# Patient Record
Sex: Male | Born: 1935 | Hispanic: Yes | Smoking: Never smoker
Health system: Southern US, Community
[De-identification: ages and names within clinical notes are randomized; demographics above are authoritative.]

## PROBLEM LIST (undated history)

## (undated) DIAGNOSIS — B029 Zoster without complications: Secondary | ICD-10-CM

## (undated) DIAGNOSIS — Z972 Presence of dental prosthetic device (complete) (partial): Secondary | ICD-10-CM

## (undated) DIAGNOSIS — R42 Dizziness and giddiness: Secondary | ICD-10-CM

## (undated) DIAGNOSIS — N289 Disorder of kidney and ureter, unspecified: Secondary | ICD-10-CM

## (undated) DIAGNOSIS — L409 Psoriasis, unspecified: Secondary | ICD-10-CM

## (undated) DIAGNOSIS — I10 Essential (primary) hypertension: Secondary | ICD-10-CM

## (undated) HISTORY — PX: ESOPHAGOGASTRODUODENOSCOPY: SHX1529

## (undated) HISTORY — DX: Zoster without complications: B02.9

---

## 2005-04-02 ENCOUNTER — Ambulatory Visit: Payer: Self-pay | Admitting: Family Medicine

## 2011-11-07 ENCOUNTER — Ambulatory Visit: Payer: Self-pay | Admitting: Internal Medicine

## 2016-12-09 ENCOUNTER — Emergency Department
Admission: EM | Admit: 2016-12-09 | Discharge: 2016-12-09 | Disposition: A | Discharge status: Home / Self Care | Payer: Medicare (Managed Care) | Attending: Emergency Medicine | Admitting: Emergency Medicine

## 2016-12-09 ENCOUNTER — Emergency Department: Payer: Medicare (Managed Care)

## 2016-12-09 ENCOUNTER — Encounter: Payer: Self-pay | Admitting: Emergency Medicine

## 2016-12-09 DIAGNOSIS — I10 Essential (primary) hypertension: Secondary | ICD-10-CM | POA: Insufficient documentation

## 2016-12-09 DIAGNOSIS — R109 Unspecified abdominal pain: Secondary | ICD-10-CM | POA: Diagnosis present

## 2016-12-09 HISTORY — DX: Essential (primary) hypertension: I10

## 2016-12-09 LAB — COMPREHENSIVE METABOLIC PANEL
ALBUMIN: 4.2 g/dL (ref 3.5–5.0)
ALK PHOS: 61 U/L (ref 38–126)
ALT: 21 U/L (ref 17–63)
AST: 28 U/L (ref 15–41)
Anion gap: 5 (ref 5–15)
BILIRUBIN TOTAL: 0.7 mg/dL (ref 0.3–1.2)
BUN: 16 mg/dL (ref 6–20)
CO2: 26 mmol/L (ref 22–32)
CREATININE: 1.02 mg/dL (ref 0.61–1.24)
Calcium: 9.1 mg/dL (ref 8.9–10.3)
Chloride: 102 mmol/L (ref 101–111)
GFR calc Af Amer: >60 mL/min (ref >60)
Glucose, Bld: 122 mg/dL — ABNORMAL HIGH (ref 65–99)
Potassium: 3.9 mmol/L (ref 3.5–5.1)
Sodium: 133 mmol/L — ABNORMAL LOW (ref 135–145)
Total Protein: 7.1 g/dL (ref 6.5–8.1)

## 2016-12-09 LAB — URINALYSIS, COMPLETE (UACMP) WITH MICROSCOPIC
BACTERIA UA: NONE SEEN
Bilirubin Urine: NEGATIVE
GLUCOSE, UA: NEGATIVE mg/dL
Hgb urine dipstick: NEGATIVE
Ketones, ur: NEGATIVE mg/dL
LEUKOCYTES UA: NEGATIVE
Nitrite: NEGATIVE
PROTEIN: NEGATIVE mg/dL
SQUAMOUS EPITHELIAL / LPF: NONE SEEN
Specific Gravity, Urine: 1.006 (ref 1.005–1.030)
pH: 7 (ref 5.0–8.0)

## 2016-12-09 LAB — CBC
HCT: 39.5 % — ABNORMAL LOW (ref 40.0–52.0)
Hemoglobin: 13.5 g/dL (ref 13.0–18.0)
MCH: 31.5 pg (ref 26.0–34.0)
MCHC: 34.2 g/dL (ref 32.0–36.0)
MCV: 92.1 fL (ref 80.0–100.0)
PLATELETS: 166 10*3/uL (ref 150–440)
RBC: 4.29 MIL/uL — AB (ref 4.40–5.90)
RDW: 13 % (ref 11.5–14.5)
WBC: 4.2 10*3/uL (ref 3.8–10.6)

## 2016-12-09 LAB — LIPASE, BLOOD: LIPASE: 29 U/L (ref 11–51)

## 2016-12-09 LAB — TROPONIN I: Troponin I: <0.03 ng/mL (ref <0.03)

## 2016-12-09 LAB — TYPE AND SCREEN
ABO/RH(D): A POS
ANTIBODY SCREEN: NEGATIVE

## 2016-12-09 LAB — PROTIME-INR
INR: 1.03
PROTHROMBIN TIME: 13.5 s (ref 11.4–15.2)

## 2016-12-09 MED ORDER — FENTANYL CITRATE (PF) 100 MCG/2ML IJ SOLN
50.0000 ug | Freq: Once | INTRAMUSCULAR | Status: AC
  Administered 2016-12-09: 50 ug via INTRAVENOUS
  Filled 2016-12-09: qty 2

## 2016-12-09 MED ORDER — IOPAMIDOL (ISOVUE-300) INJECTION 61%
100.0000 mL | Freq: Once | INTRAVENOUS | Status: AC | PRN
  Administered 2016-12-09: 100 mL via INTRAVENOUS

## 2016-12-09 MED ORDER — SODIUM CHLORIDE 0.9 % IV BOLUS (SEPSIS)
500.0000 mL | Freq: Once | INTRAVENOUS | Status: AC
  Administered 2016-12-09: 500 mL via INTRAVENOUS

## 2016-12-09 MED ORDER — IOPAMIDOL (ISOVUE-300) INJECTION 61%
30.0000 mL | Freq: Once | INTRAVENOUS | Status: AC | PRN
  Administered 2016-12-09: 30 mL via ORAL

## 2016-12-09 NOTE — ED Provider Notes (Addendum)
[facility] Emergency Department Provider Note  ____________________________________________   I have reviewed the triage vital signs and the nursing notes.   HISTORY  Chief Complaint Abdominal Pain    HPI Shane Vazquez is a 83 y.o. male who presents today complaining of abdominal pain. He feels a intermittent shocklike discomfort in both sides of his abdomen. He states he has been somewhat constipated. Stools been brown and not dark or black. He denies any melena or bright red blood per rectum. He denies any hematemesis or vomiting. He has not had pain like this before. No history of abdominal surgeries. The pain is diffuse and sharp, no other associated symptoms. Nothing makes it better nothing makes it worse. He states he may be constipated. Never had this before. Feels like his muscles are twitching he states     Past Medical History:  Diagnosis Date  . Hypertension     There are no active problems to display for this patient.   History reviewed. No pertinent surgical history.  Prior to Admission medications   Not on File    Allergies Patient has no allergy information on record.  No family history on file.  Social History Social History  Substance Use Topics  . Smoking status: Never Smoker  . Smokeless tobacco: Never Used  . Alcohol use No    Review of Systems Constitutional: No fever/chills Eyes: No visual changes. ENT: No sore throat. No stiff neck no neck pain Cardiovascular: Denies chest pain. Respiratory: Denies shortness of breath. Gastrointestinal:   no vomiting.  No diarrhea.  No constipation. Genitourinary: Negative for dysuria. Musculoskeletal: Negative lower extremity swelling Skin: Negative for rash. Neurological: Negative for severe headaches, focal weakness or numbness.   ____________________________________________   PHYSICAL EXAM:  VITAL SIGNS: ED Triage Vitals  Enc Vitals Group     BP 12/09/16 0952 (!)  162/51     Pulse Rate 12/09/16 0951 (!) 50     Resp 12/09/16 0951 18     Temp 12/09/16 0951 98.1 F (36.7 C)     Temp Source 12/09/16 0951 Oral     SpO2 12/09/16 0951 97 %     Weight 12/09/16 0952 172 lb (78 kg)     Height 12/09/16 0952 5\' 5"  (1.651 m)     Head Circumference --      Peak Flow --      Pain Score 12/09/16 0955 5     Pain Loc --      Pain Edu? --      Excl. in [place]? --     Constitutional: Alert and oriented. Well appearing and in no acute distress. Eyes: Conjunctivae are normal Head: Atraumatic HEENT: No congestion/rhinnorhea. Mucous membranes are moist.  Oropharynx non-erythematous Neck:   Nontender with no meningismus, no masses, no stridor Cardiovascular: Normal rate, regular rhythm. Grossly normal heart sounds.  Good peripheral circulation. Respiratory: Normal respiratory effort.  No retractions. Lungs CTAB. Abdominal: Is soft, diffusely mildly tender, no focal mass or AAA. There is no guarding or rebound. Back:  There is no focal tenderness or step off.  there is no midline tenderness there are no lesions noted. there is no CVA tenderness Musculoskeletal: No lower extremity tenderness, no upper extremity tenderness. No joint effusions, no DVT signs strong distal pulses no edema Neurologic:  Normal speech and language. No gross focal neurologic deficits are appreciated.  Skin:  Skin is warm, dry and intact. No rash noted. Psychiatric: Mood and affect are normal. Speech and behavior  are normal.  ____________________________________________   LABS (all labs ordered are listed, but only abnormal results are displayed)  Labs Reviewed  COMPREHENSIVE METABOLIC PANEL - Abnormal; Notable for the following:       Result Value   Sodium 133 (*)    Glucose, Bld 122 (*)    All other components within normal limits  CBC - Abnormal; Notable for the following:    RBC 4.29 (*)    HCT 39.5 (*)    All other components within normal limits  URINALYSIS, COMPLETE (UACMP) WITH  MICROSCOPIC - Abnormal; Notable for the following:    Color, Urine STRAW (*)    APPearance CLEAR (*)    All other components within normal limits  LIPASE, BLOOD  PROTIME-INR  TYPE AND SCREEN   ____________________________________________  EKG  I personally interpreted any EKGs ordered by me or triage Normal sinus rhythm rate 51 beats per redness, but bradycardia noted. No ischemic changes noted. Nonspecific ST changes ____________________________________________  RADIOLOGY  I reviewed any imaging ordered by me or triage that were performed during my shift and, if possible, patient and/or family made aware of any abnormal findings. ____________________________________________   PROCEDURES  Procedure(s) performed: None  Procedures  Critical Care performed: None  ____________________________________________   INITIAL IMPRESSION / ASSESSMENT AND PLAN / ED COURSE  Pertinent labs & imaging results that were available during my care of the patient were reviewed by me and considered in my medical decision making (see chart for details).  Patient here 3 days of abdominal pain, blood work very reassuring vital signs are reassuring, reproducible pain with suspicion for ACS but he will do EKG. Patient is pain-free after fentanyl. Serial abdominal exams are benign. We will see what CT shows.  ----------------------------------------- 12:49 PM on 12/09/2016 -----------------------------------------  CT scan blood work and EKG vurinalysis and exam all very reassuring on this patient. I will check to see if he is guaiac positive although his hemoglobin is normal and we will await results of troponin although with 3 days of ongoing pain I don't think serial enzymes will be indicated in the has no chest pain or shortness of breath since mid abdominal discomfort. Nothing to suggest ischemia, CT certainly doesn't show anything like that, exam and don't think is consistent with "pain out of  proportionto exam", history is not really consistent with this, he has electric shock sensation. Can be muscle spasm as opposed, and white count normal, not clear exactly why this gentleman has been having abdominal pain.  ----------------------------------------- 1:00 PM on 12/09/2016 -----------------------------------------  Abdomen completely benign he has had no discomfort since arrival, workup is completely reassuring. There is some atherosclerotic changes in his abdomen which is not unusual for a 83 year old gentleman. However there is no evidence of acute ischemia. I think we'll have him follow-up with vascular surgery and they can evaluate him if they think that there is some chance of that but at this time I don't think he requires emergent surgery certainly no evidence despite 3 days of pain of ischemia on CT or blood work and he is pain-free at this time. It's a very unusual "electric shock" like discomfort that comes shooting and then goes completely away which is certainly atypical for ischemia. Is not worse with food. He has had no food avoidance or other classic symptoms of abdominal angina. He states that it's a shock that seems to go through his muscles. Certainly could also be radiculopathy. He is neurologically intact however. No evidence of cauda  equina syndrome. I feel at this point no further workup is required in the emergency room he we will see if he can tolerate by mouth if he can we will discharge him. Patient and family made aware of all findings including prostate issues etc. and the need for close outpatient follow-up with these results with primary care. They will follow-up he states.    ____________________________________________   FINAL CLINICAL IMPRESSION(S) / ED DIAGNOSES  Final diagnoses:  None      This chart was dictated using voice recognition software.  Despite best efforts to proofread,  errors can occur which can change meaning.      Schuyler Amor, MD 12/09/16 1157    Schuyler Amor, MD 12/09/16 1250    Schuyler Amor, MD 12/09/16 1302    Schuyler Amor, MD 12/09/16 [phone]

## 2016-12-09 NOTE — Discharge Instructions (Signed)
usted tiene aterosclerosis y su prstata est un poco agrandada, pero en este momento no hay hallazgos agudos en su abdomen. Haga un seguimiento pronto con el mdico de atencin primaria, y regrese al departamento de emergencias por cualquier sntoma nuevo o preocupante, como aumento del dolor, fiebre o vmitos.

## 2016-12-09 NOTE — ED Triage Notes (Signed)
Pt presents to ER accompanied by daughter with complaints of abdominal pain for past couple of days. pt reports pain started on LUQ reports moved to RLQ reports pain is sharp and is intermittent, denies any vomiting

## 2016-12-12 DIAGNOSIS — I10 Essential (primary) hypertension: Secondary | ICD-10-CM | POA: Insufficient documentation

## 2016-12-13 ENCOUNTER — Ambulatory Visit (INDEPENDENT_AMBULATORY_CARE_PROVIDER_SITE_OTHER): Payer: Medicare (Managed Care) | Admitting: Surgery

## 2016-12-13 ENCOUNTER — Encounter: Payer: Self-pay | Admitting: Surgery

## 2016-12-13 VITALS — BP 198/69 | HR 57 | Temp 97.8°F | Ht 66.0 in | Wt 169.0 lb

## 2016-12-13 DIAGNOSIS — R1013 Epigastric pain: Secondary | ICD-10-CM

## 2016-12-13 DIAGNOSIS — G8929 Other chronic pain: Secondary | ICD-10-CM

## 2016-12-13 NOTE — Progress Notes (Signed)
Shane Vazquez is an 83 y.o. male.   Chief Complaint: abdominal pain HPI: This is an outpatient consult requested by Dr. Burlene Arnt in the ED. Interviews held with a qualified interpreter. Patient describes epigastric pain and points to both right and left upper quadrants. Is been going on for several days is improved now but still present. He was in the emergency room. He's had no nausea vomiting no fevers or chills no weight loss. As have a strong family history of gastric cancer in his father. As not had any abdominal surgery and his only medical problem is hypertension.   Past Medical History:  Diagnosis Date  . Hypertension     Past Surgical History:  Procedure Laterality Date  . NO PAST SURGERIES      Family History  Problem Relation Age of Onset  . Stomach cancer Mother   . Heart disease Father    Social History:  reports that he has never smoked. He has never used smokeless tobacco. He reports that he does not drink alcohol or use drugs.  Allergies: No Known Allergies   (Not in a hospital admission)   Review of Systems:   Review of Systems  Constitutional: Negative.   HENT: Negative.   Eyes: Negative.   Respiratory: Negative.   Cardiovascular: Negative.   Gastrointestinal: Positive for abdominal pain. Negative for heartburn, nausea and vomiting.  Genitourinary: Negative.   Musculoskeletal: Negative.   Skin: Negative.   Neurological: Negative.   Endo/Heme/Allergies: Negative.   Psychiatric/Behavioral: Negative.     Physical Exam:  Physical Exam  Constitutional: He is oriented to person, place, and time and well-developed, well-nourished, and in no distress. No distress.  HENT:  Head: Normocephalic and atraumatic.  Eyes: Pupils are equal, round, and reactive to light. Right eye exhibits no discharge. Left eye exhibits no discharge. No scleral icterus.  Neck: Normal range of motion.  Cardiovascular: Normal rate, regular rhythm and normal heart sounds.    Pulmonary/Chest: Effort normal and breath sounds normal. No respiratory distress. He has no wheezes. He has no rales.  Abdominal: Soft. Bowel sounds are normal. He exhibits distension. There is no tenderness. There is no rebound and no guarding.  Distended and slightly firm abdomen with only minimal if any tenderness in both upper quadrants. There is a rectus diastases. No obvious umbilical hernia. There are no peritoneal signs. The abdomen just generally feels firm.  Musculoskeletal: Normal range of motion. He exhibits no edema or tenderness.  Lymphadenopathy:    He has no cervical adenopathy.  Neurological: He is alert and oriented to person, place, and time.  Skin: Skin is warm and dry. No rash noted. He is not diaphoretic. No erythema.  Vitals reviewed.   Blood pressure (!) 198/69, pulse (!) 57, temperature 97.8 F (36.6 C), temperature source Oral, height 5\' 6"  (1.676 m), weight 169 lb (76.7 kg).    No results found for this or any previous visit (from the past 48 hour(s)). No results found.   Assessment/Plan This patient is interview was assisted with a qualified interpreter. He was in the emergency room with abdominal pain is abdominal pain is better but not gone and he points to both upper quadrants. On exam he feels firm but minimally if any tenderness is noted. Certainly no peritoneal signs are identified. CT scan is personally reviewed as are her labs. My concern is that this patient has one of 2 problems. 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 I  would like him to see a vascular surgeon concerning this as this might be one of the causes of his abdominal pain as a low-flow state although there is no sign of ongoing ischemia at this time. He may benefit from stenting.  The other alternative is that he has a very strong family history of gastric cancer and there is some thickening seen on multiple cuts of the pylorus. There is also an  intraluminal mass effect seen by me personally. I would like to have him evaluated by GI physician I will send a GI referral for him as well.  This was all discussed via the interpreter with he and his family member and he will follow-up with [place] in 3 weeks.  Florene Glen, MD, FACS

## 2016-12-13 NOTE — Patient Instructions (Addendum)
Yo le voy a Market researcher para que vea al [facility] y al Human resources officer. Si tiene preguntas, por favor llameme al [phone] (Enfermera: Kellen Hover).  Lo vamos a volver a ver en cuatro semanas.

## 2016-12-14 ENCOUNTER — Telehealth: Payer: Self-pay

## 2016-12-14 NOTE — Telephone Encounter (Signed)
Called patient's daughter-Diana and gave her the information for the appointments seen below. Beverlee Nims did not have any further questions.

## 2016-12-14 NOTE — Telephone Encounter (Signed)
-----   Message from Mickie Kay sent at 12/14/2016 10:43 AM EDT ----- Regarding: RE: referrals Patient's appt's have been made.   GI-Dr Marius Ditch 01/17/17 @ 9:00am @ the [facility] 01/22/17 @ 8:30am with Dr Dew--[address], [place], [place] [address]  Please call patient to advise.   ----- Message ----- From: Wayna Chalet, [place] Sent: 12/13/2016   2:49 PM To: Albin Felling Brouillard Subject: referrals                                      Can you please refer patient to be seen by Dr. Vicente Males for luminal narrowing of the pylorus which is less evident on the delayed images, probably due to peristalsis and Dr. 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 Thank you!  Patient will need an EGD.

## 2016-12-16 ENCOUNTER — Emergency Department
Admission: EM | Admit: 2016-12-16 | Discharge: 2016-12-16 | Disposition: A | Discharge status: Home / Self Care | Payer: Medicare (Managed Care) | Attending: Emergency Medicine | Admitting: Emergency Medicine

## 2016-12-16 DIAGNOSIS — R109 Unspecified abdominal pain: Secondary | ICD-10-CM | POA: Diagnosis not present

## 2016-12-16 DIAGNOSIS — B029 Zoster without complications: Secondary | ICD-10-CM | POA: Insufficient documentation

## 2016-12-16 DIAGNOSIS — I10 Essential (primary) hypertension: Secondary | ICD-10-CM | POA: Diagnosis not present

## 2016-12-16 DIAGNOSIS — R1084 Generalized abdominal pain: Secondary | ICD-10-CM

## 2016-12-16 LAB — CBC
HCT: 38 % — ABNORMAL LOW (ref 40.0–52.0)
HEMOGLOBIN: 13 g/dL (ref 13.0–18.0)
MCH: 31.6 pg (ref 26.0–34.0)
MCHC: 34.3 g/dL (ref 32.0–36.0)
MCV: 92 fL (ref 80.0–100.0)
Platelets: 154 10*3/uL (ref 150–440)
RBC: 4.13 MIL/uL — AB (ref 4.40–5.90)
RDW: 12.9 % (ref 11.5–14.5)
WBC: 4.6 10*3/uL (ref 3.8–10.6)

## 2016-12-16 LAB — URINALYSIS, COMPLETE (UACMP) WITH MICROSCOPIC
Bacteria, UA: NONE SEEN
Bilirubin Urine: NEGATIVE
GLUCOSE, UA: NEGATIVE mg/dL
HGB URINE DIPSTICK: NEGATIVE
KETONES UR: 5 mg/dL — AB
Leukocytes, UA: NEGATIVE
NITRITE: NEGATIVE
PROTEIN: 30 mg/dL — AB
Specific Gravity, Urine: 1.017 (ref 1.005–1.030)
Squamous Epithelial / LPF: NONE SEEN
pH: 6 (ref 5.0–8.0)

## 2016-12-16 LAB — COMPREHENSIVE METABOLIC PANEL
ALK PHOS: 65 U/L (ref 38–126)
ALT: 18 U/L (ref 17–63)
ANION GAP: 8 (ref 5–15)
AST: 27 U/L (ref 15–41)
Albumin: 4 g/dL (ref 3.5–5.0)
BILIRUBIN TOTAL: 1.2 mg/dL (ref 0.3–1.2)
BUN: 10 mg/dL (ref 6–20)
CALCIUM: 9.2 mg/dL (ref 8.9–10.3)
CO2: 27 mmol/L (ref 22–32)
Chloride: 96 mmol/L — ABNORMAL LOW (ref 101–111)
Creatinine, Ser: 1.14 mg/dL (ref 0.61–1.24)
GFR calc non Af Amer: 57 mL/min — ABNORMAL LOW (ref >60)
Glucose, Bld: 143 mg/dL — ABNORMAL HIGH (ref 65–99)
Potassium: 4.4 mmol/L (ref 3.5–5.1)
Sodium: 131 mmol/L — ABNORMAL LOW (ref 135–145)
TOTAL PROTEIN: 6.9 g/dL (ref 6.5–8.1)

## 2016-12-16 LAB — LACTIC ACID, PLASMA: Lactic Acid, Venous: 1.2 mmol/L (ref 0.5–1.9)

## 2016-12-16 LAB — LIPASE, BLOOD: LIPASE: 29 U/L (ref 11–51)

## 2016-12-16 MED ORDER — ACYCLOVIR 800 MG PO TABS: 800.0000 mg | ORAL_TABLET | Freq: Every day | ORAL | 0 refills | Status: AC

## 2016-12-16 MED ORDER — TRAMADOL HCL 50 MG PO TABS: 50.0000 mg | ORAL_TABLET | Freq: Four times a day (QID) | ORAL | 0 refills | Status: AC | PRN

## 2016-12-16 NOTE — ED Notes (Signed)
Pt from waiting room =- labs drawn in triage. Interpreter notification sent. Pt here for abd pain x 1 week.

## 2016-12-16 NOTE — Discharge Instructions (Signed)
Please seek medical attention for any high fevers, chest pain, shortness of breath, change in behavior, persistent vomiting, bloody stool or any other new or concerning symptoms.  

## 2016-12-16 NOTE — ED Triage Notes (Signed)
Pt presents to ED via POV with daughter and c/o RLQ pain. Pt was seen here 1 week ago for same and has a follow-up appointment on Sept 6 for unknown diagnosis. Pt denies N/V/D. Pt is A&O, in NAD, RR even, regular, and unlabored.

## 2016-12-16 NOTE — ED Provider Notes (Signed)
[facility] Emergency Department Provider Note  ____________________________________________   I have reviewed the triage vital signs and the nursing notes.   HISTORY  Chief Complaint Abdominal Pain   History limited by: Loraine utilized   HPI Shane Vazquez is a 83 y.o. male who presents to the emergency department today with continued abdominal pain. It is located bilaterally. It will come and go. Patient describes it as getting an electric shock. It is severe. It did prevent him from sleeping last night. Was seen in the emergency department for this with work up including CT scan. Did follow up with surgery. Today patient also mentioned a rash on his right thigh. It has been present for roughly 8 days, he did not mention the rash to any previous providers. It is painful.    Past Medical History:  Diagnosis Date  . Hypertension     Patient Active Problem List   Diagnosis Date Noted  . Essential hypertension 12/12/2016    Past Surgical History:  Procedure Laterality Date  . NO PAST SURGERIES      Prior to Admission medications   Not on File    Allergies Patient has no known allergies.  Family History  Problem Relation Age of Onset  . Stomach cancer Mother   . Heart disease Father     Social History Social History  Substance Use Topics  . Smoking status: Never Smoker  . Smokeless tobacco: Never Used  . Alcohol use No    Review of Systems Constitutional: No fever/chills Eyes: No visual changes. ENT: No sore throat. Cardiovascular: Denies chest pain. Respiratory: Denies shortness of breath. Gastrointestinal: Positive for abdominal pain. Genitourinary: Negative for dysuria. Musculoskeletal: Negative for back pain. Skin: Positive for rash to right thigh Neurological: Negative for headaches, focal weakness or numbness.  ____________________________________________   PHYSICAL EXAM:  VITAL SIGNS: ED  Triage Vitals  Enc Vitals Group     BP 12/16/16 1327 (!) 155/60     Pulse Rate 12/16/16 1327 65     Resp 12/16/16 1327 18     Temp 12/16/16 1327 98.3 F (36.8 C)     Temp Source 12/16/16 1327 Oral     SpO2 12/16/16 1327 97 %     Weight 12/16/16 1324 175 lb (79.4 kg)     Height 12/16/16 1324 5\' 5"  (1.651 m)     Head Circumference --      Peak Flow --      Pain Score 12/16/16 1324 7   Constitutional: Alert and oriented. Well appearing and in no distress. Eyes: Conjunctivae are normal.  ENT   Head: Normocephalic and atraumatic.   Nose: No congestion/rhinnorhea.   Mouth/Throat: Mucous membranes are moist.   Neck: No stridor. Hematological/Lymphatic/Immunilogical: No cervical lymphadenopathy. Cardiovascular: Normal rate, regular rhythm.  No murmurs, rubs, or gallops.  Respiratory: Normal respiratory effort without tachypnea nor retractions. Breath sounds are clear and equal bilaterally. No wheezes/rales/rhonchi. Gastrointestinal: Soft and non tender. No rebound. No guarding.  Genitourinary: Deferred Musculoskeletal: Normal range of motion in all extremities. No lower extremity edema. Neurologic:  Normal speech and language. No gross focal neurologic deficits are appreciated.  Skin:  Rash to right thigh, primarily in the frontal and lateral aspects. Vesicular.  Psychiatric: Mood and affect are normal. Speech and behavior are normal. Patient exhibits appropriate insight and judgment.  ____________________________________________    LABS (pertinent positives/negatives)  Labs Reviewed  COMPREHENSIVE METABOLIC PANEL - Abnormal; Notable for the following:  Result Value   Sodium 131 (*)    Chloride 96 (*)    Glucose, Bld 143 (*)    GFR calc non Af Amer 57 (*)    All other components within normal limits  CBC - Abnormal; Notable for the following:    RBC 4.13 (*)    HCT 38.0 (*)    All other components within normal limits  URINALYSIS, COMPLETE (UACMP) WITH  MICROSCOPIC - Abnormal; Notable for the following:    Color, Urine YELLOW (*)    APPearance CLEAR (*)    Ketones, ur 5 (*)    Protein, ur 30 (*)    All other components within normal limits  LIPASE, BLOOD  LACTIC ACID, PLASMA  LACTIC ACID, PLASMA     ____________________________________________   EKG  None  ____________________________________________    RADIOLOGY  None  ____________________________________________   PROCEDURES  Procedures  ____________________________________________   INITIAL IMPRESSION / ASSESSMENT AND PLAN / ED COURSE  Pertinent labs & imaging results that were available during my care of the patient were reviewed by me and considered in my medical decision making (see chart for details).  Patient presented to the emergency department today with concerns for continued abdominal pain. Today he did also mention a rash on his right thigh. The rash is concerning for possible symptoms. I do wonder if this plane rolled the patient's pain. Will place patient on antivirals. Will give patient pain medication. Patient will follow-up.  ____________________________________________   FINAL CLINICAL IMPRESSION(S) / ED DIAGNOSES  Final diagnoses:  Generalized abdominal pain  Herpes zoster without complication     Note: This dictation was prepared with Dragon dictation. Any transcriptional errors that result from this process are unintentional     Nance Pear, MD 12/16/16 [phone]

## 2016-12-16 NOTE — ED Notes (Signed)
Per dr pt has shingles and is not on antiviral -

## 2016-12-16 NOTE — ED Notes (Signed)
This RN to bedside at this time to introduce self to patient and his daughter. Pt is noted to be alert and oriented, walking around room without difficulty. This RN explained will need to place IV for further blood work, pt and daughter state understanding. Will continue to monitor for further patient needs.

## 2017-01-16 ENCOUNTER — Ambulatory Visit: Payer: Self-pay | Admitting: Surgery

## 2017-01-17 ENCOUNTER — Other Ambulatory Visit: Payer: Self-pay

## 2017-01-17 ENCOUNTER — Ambulatory Visit: Payer: Self-pay | Admitting: Gastroenterology

## 2017-01-17 ENCOUNTER — Encounter (INDEPENDENT_AMBULATORY_CARE_PROVIDER_SITE_OTHER): Payer: Self-pay

## 2017-01-17 ENCOUNTER — Encounter: Payer: Self-pay | Admitting: Gastroenterology

## 2017-01-17 ENCOUNTER — Ambulatory Visit (INDEPENDENT_AMBULATORY_CARE_PROVIDER_SITE_OTHER): Payer: Medicare HMO | Admitting: Gastroenterology

## 2017-01-17 VITALS — BP 138/57 | HR 58 | Temp 98.2°F | Ht 66.0 in | Wt 162.6 lb

## 2017-01-17 DIAGNOSIS — R1031 Right lower quadrant pain: Secondary | ICD-10-CM | POA: Diagnosis not present

## 2017-01-17 DIAGNOSIS — R933 Abnormal findings on diagnostic imaging of other parts of digestive tract: Secondary | ICD-10-CM | POA: Diagnosis not present

## 2017-01-17 NOTE — Addendum Note (Signed)
Addended by: Vanetta Mulders on: 01/17/2017 04:20 PM   Modules accepted: Orders, SmartSet

## 2017-01-17 NOTE — Progress Notes (Addendum)
Cephas Darby, MD [address]  [facility]  [place], [place] [address]  Main: [phone]  Fax: [phone]    Gastroenterology Consultation  Referring Provider:     Florene Glen, MD Primary Care Physician:  Phoebe Perch Primary Gastroenterologist:  Dr. Cephas Darby Reason for Consultation:     RLQ pain         HPI:   Shane Vazquez is a 83 y.o. y/o male referred  for consultation & management of 1 month history of right lower quadrant pain. Patient is Spanish-speaking and is accompanied by her daughter who helped with interpretation. The pain was initially in the left upper quadrant, moved to right lower quadrant, sharp, intermittent, 5/10 in severity, lasts for about 5 minutes, not associated with nausea, vomiting. Sporadic in nature. He denies any aggravating or relieving factors. He went to ER twice in last 1 month due to a poor symptoms and had blood work, abdominal imaging. He reports that his stools are loose and dark brown since the pain started. He started gives him stool softener about 3 times a week for history of constipation. He denies upper abdominal pain, trouble swallowing, melena, hematochezia, rectal bleeding, diarrhea. He had CT A/P on 12/09/16 showed luminal narrowing of the pylorus, cholelithiasis, pancolonic diverticulosis. LFTs, CBC, lipase was normal. He was taking Motrin 400 mg 2-3 pills for the pain, presently he is taking tramadol and gabapentin. He recently had a checkup in [place] prior to moving to [place].   GI Procedures: None  Past Medical History:  Diagnosis Date  . Hypertension     Past Surgical History:  Procedure Laterality Date  . NO PAST SURGERIES      Prior to Admission medications   Medication Sig Start Date End Date Taking? Authorizing Provider  gabapentin (NEURONTIN) 300 MG capsule TAKE ONE CAPSULE BY MOUTH 3 TIMES A DAY AS NEEDED 12/25/16   [provider]  ibuprofen (ADVIL,MOTRIN) 200 MG tablet Take 200 mg by mouth every  6 (six) hours as needed.    [provider]  traMADol (ULTRAM) 50 MG tablet Take 1 tablet (50 mg total) by mouth every 6 (six) hours as needed. 12/16/16 12/16/17  Nance Pear, MD    Family History  Problem Relation Age of Onset  . Stomach cancer Mother   . Heart disease Father      Social History  Substance Use Topics  . Smoking status: Never Smoker  . Smokeless tobacco: Never Used  . Alcohol use No    Allergies as of 01/17/2017  . (No Known Allergies)    Review of Systems:    All systems reviewed and negative except where noted in HPI.   Physical Exam:  BP (!) 138/57   Pulse (!) 58   Temp 98.2 F (36.8 C)   Ht 5\' 6"  (1.676 m)   Wt 162 lb 9.6 oz (73.8 kg)   BMI 26.24 kg/m  No LMP for male patient.  General:   Alert,  Well-developed, well-nourished, pleasant and cooperative in NAD Head:  Normocephalic and atraumatic. Eyes:  Sclera clear, no icterus.   Conjunctiva pink. Ears:  Normal auditory acuity. Neck:  Supple; no masses or thyromegaly. Lungs:  Respirations even and unlabored.  Clear throughout to auscultation.   No wheezes, crackles, or rhonchi. No acute distress. Heart:  Regular rate and rhythm; no murmurs, clicks, rubs, or gallops. Abdomen:  Normal bowel sounds.  No bruits.  Soft, mild tenderness in the right lower quadrant and  non-distended without masses, hepatosplenomegaly or hernias noted.  No guarding or rebound tenderness.   Rectal: Nor performed Msk:  Symmetrical without gross deformities.  Pulses:  Normal pulses noted. Extremities:  No clubbing or edema.  No cyanosis. Neurologic:  Alert and oriented x3; Skin:  Psoriatic rash on his palms, lower back. No jaundice. Psych:  Alert and cooperative. Normal mood and affect.  Imaging Studies: Reviewed  Assessment and Plan:   Shane Vazquez is a 83 y.o. y/o male with Hypertension, otherwise healthy with 1 month history of intermittent right lower quadrant pain. CT A/P showed possible narrowing of the  pylorus, pancolonic diverticulosis and cholelithiasis. He may have mild attack of diverticulitis. As he never had a colonoscopy, malignancy needs to be ruled out. I also recommend EGD due to possible narrowing of the pylorus seen on the CT.  I have discussed alternative options, risks & benefits,  which include, but are not limited to, bleeding, infection, perforation,respiratory complication & drug reaction.  The patient agrees with this plan & written consent will be obtained.    Start MiraLAX 17 g daily to manage constipation  Follow up based on the EGD and colonoscopy results   Cephas Darby, MD

## 2017-01-22 ENCOUNTER — Ambulatory Visit (INDEPENDENT_AMBULATORY_CARE_PROVIDER_SITE_OTHER): Payer: Medicare HMO | Admitting: Vascular Surgery

## 2017-01-22 ENCOUNTER — Encounter (INDEPENDENT_AMBULATORY_CARE_PROVIDER_SITE_OTHER): Payer: Self-pay | Admitting: Vascular Surgery

## 2017-01-22 VITALS — BP 165/64 | HR 47 | Resp 16 | Ht 61.0 in | Wt 160.0 lb

## 2017-01-22 DIAGNOSIS — K551 Chronic vascular disorders of intestine: Secondary | ICD-10-CM

## 2017-01-22 DIAGNOSIS — I10 Essential (primary) hypertension: Secondary | ICD-10-CM

## 2017-01-22 DIAGNOSIS — I771 Stricture of artery: Secondary | ICD-10-CM

## 2017-01-22 DIAGNOSIS — R1031 Right lower quadrant pain: Secondary | ICD-10-CM | POA: Diagnosis not present

## 2017-01-22 DIAGNOSIS — I700 Atherosclerosis of aorta: Secondary | ICD-10-CM | POA: Diagnosis not present

## 2017-01-22 NOTE — Progress Notes (Signed)
Patient ID: Shane Vazquez, male   DOB: 30-Jan-1930, 83 y.o.   MRN: [id number]  Chief Complaint  Patient presents with  . New Patient (Initial Visit)    ASO of Aorta and coronary arteries    HPI Shane Vazquez is a 83 y.o. male.  I am asked to see the patient by Dr. Burt Knack for evaluation of aortic and visceral atherosclerosis.  The patient reports Intermittent abdominal pain including an episode last month which prompted an emergency room visit and a CT scan which I have independently reviewed. He has moderate aortic atherosclerosis including extension into some of the branch vessels. There is certainly at least a question of SMA stenosis although on the 5 mm cuts that is a little hard to say for sure. His abdominal pain has improved. He was diagnosed with shingles and that appears to be the cause of the pain. He has no pain today and his pain has generally been much better. In questioning, we discussed claudication symptoms, food fear, unintentional weight loss, or postprandial abdominal pain. He does not have any of the symptoms. He says he walks at least 30 minutes a day and was a vigorous soccer player for many years which is helped him remain quite active. He denies ischemic rest pain or ulceration. He has no history of vascular interventions or procedures to his knowledge.   Past Medical History:  Diagnosis Date  . Hypertension   . Shingles     Past Surgical History:  Procedure Laterality Date  . NO PAST SURGERIES      Family History  Problem Relation Age of Onset  . Stomach cancer Mother   . Heart disease Father   No bleeding or clotting disorders  Social History Social History  Substance Use Topics  . Smoking status: Never Smoker  . Smokeless tobacco: Never Used  . Alcohol use No   No IVDU  No Known Allergies  Current Outpatient Prescriptions  Medication Sig Dispense Refill  . gabapentin (NEURONTIN) 300 MG capsule TAKE ONE CAPSULE BY MOUTH 3 TIMES A DAY AS NEEDED  0  .  losartan (COZAAR) 100 MG tablet Take 100 mg by mouth daily.    . traMADol (ULTRAM) 50 MG tablet Take 1 tablet (50 mg total) by mouth every 6 (six) hours as needed. 15 tablet 0  . ibuprofen (ADVIL,MOTRIN) 200 MG tablet Take 200 mg by mouth every 6 (six) hours as needed.     No current facility-administered medications for this visit.       REVIEW OF SYSTEMS (Negative unless checked)  Constitutional: []Weight loss  []Fever  []Chills Cardiac: []Chest pain   []Chest pressure   []Palpitations   []Shortness of breath when laying flat   []Shortness of breath at rest   []Shortness of breath with exertion. Vascular:  []Pain in legs with walking   []Pain in legs at rest   []Pain in legs when laying flat   []Claudication   []Pain in feet when walking  []Pain in feet at rest  []Pain in feet when laying flat   []History of DVT   []Phlebitis   []Swelling in legs   []Varicose veins   []Non-healing ulcers Pulmonary:   []Uses home oxygen   []Productive cough   []Hemoptysis   []Wheeze  []COPD   []Asthma Neurologic:  [x]Dizziness  []Blackouts   []Seizures   []History of stroke   []History of TIA  []Aphasia   []Temporary blindness   []Dysphagia   []Weakness or numbness in  arms   []Weakness or numbness in legs Musculoskeletal:  [x]Arthritis   []Joint swelling   []Joint pain   []Low back pain Hematologic:  []Easy bruising  []Easy bleeding   []Hypercoagulable state   []Anemic  []Hepatitis Gastrointestinal:  []Blood in stool   []Vomiting blood  []Gastroesophageal reflux/heartburn   [x]Abdominal pain Genitourinary:  []Chronic kidney disease   []Difficult urination  []Frequent urination  []Burning with urination   []Hematuria Skin:  []Rashes   []Ulcers   []Wounds Psychological:  []History of anxiety   [] History of major depression.    Physical Exam BP (!) 165/64 (BP Location: Right Arm)   Pulse (!) 47   Resp 16   Ht 5' 1" (1.549 m)   Wt 160 lb (72.6 kg)   BMI 30.23 kg/m  Gen:  WD/WN, NAD. Appears younger  than stated age. Head: [place]/AT, No temporalis wasting.  Ear/Nose/Throat: Hearing grossly intact, nares w/o erythema or drainage, oropharynx w/o Erythema/Exudate Eyes: Conjunctiva clear, sclera non-icteric  Neck: trachea midline.  No bruit or JVD.  Pulmonary:  Good air movement, clear to auscultation bilaterally.  Cardiac: RRR, normal S1, S2, no Murmurs, rubs or gallops. Vascular:  Vessel Right Left  Radial Palpable Palpable  Ulnar Palpable Palpable  Brachial Palpable Palpable  Carotid Palpable, without bruit Palpable, without bruit  Aorta Not palpable N/A  Femoral Palpable Palpable  Popliteal Palpable Palpable  PT Palpable Palpable  DP Palpable Palpable   Gastrointestinal: soft, non-tender/non-distended.  Musculoskeletal: M/S 5/5 throughout.  Extremities without ischemic changes.  No deformity or atrophy. No edema. Neurologic: Sensation grossly intact in extremities.  Symmetrical.  Speech is fluent. Motor exam as listed above. Psychiatric: Judgment intact, Mood & affect appropriate for pt's clinical situation. Dermatologic: No rashes or ulcers noted.  No cellulitis or open wounds.  Radiology No results found.  Labs Recent Results (from the past 2160 hour(s))  Lipase, blood     Status: None   Collection Time: 12/09/16 10:03 AM  Result Value Ref Range   Lipase 29 11 - 51 U/L  Comprehensive metabolic panel     Status: Abnormal   Collection Time: 12/09/16 10:03 AM  Result Value Ref Range   Sodium 133 (L) 135 - 145 mmol/L   Potassium 3.9 3.5 - 5.1 mmol/L   Chloride 102 101 - 111 mmol/L   CO2 26 22 - 32 mmol/L   Glucose, Bld 122 (H) 65 - 99 mg/dL   BUN 16 6 - 20 mg/dL   Creatinine, Ser 1.02 0.61 - 1.24 mg/dL   Calcium 9.1 8.9 - 10.3 mg/dL   Total Protein 7.1 6.5 - 8.1 g/dL   Albumin 4.2 3.5 - 5.0 g/dL   AST 28 15 - 41 U/L   ALT 21 17 - 63 U/L   Alkaline Phosphatase 61 38 - 126 U/L   Total Bilirubin 0.7 0.3 - 1.2 mg/dL   GFR calc non Af Amer >60 >60 mL/min   GFR calc Af  Amer >60 >60 mL/min    Comment: (NOTE) The eGFR has been calculated using the CKD EPI equation. This calculation has not been validated in all clinical situations. eGFR's persistently <60 mL/min signify possible Chronic Kidney Disease.    Anion gap 5 5 - 15  CBC     Status: Abnormal   Collection Time: 12/09/16 10:03 AM  Result Value Ref Range   WBC 4.2 3.8 - 10.6 K/uL   RBC 4.29 (L) 4.40 - 5.90 MIL/uL   Hemoglobin 13.5 13.0 - 18.0 g/dL  HCT 39.5 (L) 40.0 - 52.0 %   MCV 92.1 80.0 - 100.0 fL   MCH 31.5 26.0 - 34.0 pg   MCHC 34.2 32.0 - 36.0 g/dL   RDW 13.0 11.5 - 14.5 %   Platelets 166 150 - 440 K/uL  Troponin I     Status: None   Collection Time: 12/09/16 10:03 AM  Result Value Ref Range   Troponin I <0.03 <0.03 ng/mL  Urinalysis, Complete w Microscopic     Status: Abnormal   Collection Time: 12/09/16 10:24 AM  Result Value Ref Range   Color, Urine STRAW (A) YELLOW   APPearance CLEAR (A) CLEAR   Specific Gravity, Urine 1.006 1.005 - 1.030   pH 7.0 5.0 - 8.0   Glucose, UA NEGATIVE NEGATIVE mg/dL   Hgb urine dipstick NEGATIVE NEGATIVE   Bilirubin Urine NEGATIVE NEGATIVE   Ketones, ur NEGATIVE NEGATIVE mg/dL   Protein, ur NEGATIVE NEGATIVE mg/dL   Nitrite NEGATIVE NEGATIVE   Leukocytes, UA NEGATIVE NEGATIVE   RBC / HPF 0-5 0 - 5 RBC/hpf   WBC, UA 0-5 0 - 5 WBC/hpf   Bacteria, UA NONE SEEN NONE SEEN   Squamous Epithelial / LPF NONE SEEN NONE SEEN  Protime-INR     Status: None   Collection Time: 12/09/16 10:24 AM  Result Value Ref Range   Prothrombin Time 13.5 11.4 - 15.2 seconds   INR 1.03   Type and screen [facility]     Status: None   Collection Time: 12/09/16 10:24 AM  Result Value Ref Range   ABO/RH(D) A POS    Antibody Screen NEG    Sample Expiration 12/12/2016   Lipase, blood     Status: None   Collection Time: 12/16/16  1:27 PM  Result Value Ref Range   Lipase 29 11 - 51 U/L  Comprehensive metabolic panel     Status: Abnormal    Collection Time: 12/16/16  1:27 PM  Result Value Ref Range   Sodium 131 (L) 135 - 145 mmol/L   Potassium 4.4 3.5 - 5.1 mmol/L   Chloride 96 (L) 101 - 111 mmol/L   CO2 27 22 - 32 mmol/L   Glucose, Bld 143 (H) 65 - 99 mg/dL   BUN 10 6 - 20 mg/dL   Creatinine, Ser 1.14 0.61 - 1.24 mg/dL   Calcium 9.2 8.9 - 10.3 mg/dL   Total Protein 6.9 6.5 - 8.1 g/dL   Albumin 4.0 3.5 - 5.0 g/dL   AST 27 15 - 41 U/L   ALT 18 17 - 63 U/L   Alkaline Phosphatase 65 38 - 126 U/L   Total Bilirubin 1.2 0.3 - 1.2 mg/dL   GFR calc non Af Amer 57 (L) >60 mL/min   GFR calc Af Amer >60 >60 mL/min    Comment: (NOTE) The eGFR has been calculated using the CKD EPI equation. This calculation has not been validated in all clinical situations. eGFR's persistently <60 mL/min signify possible Chronic Kidney Disease.    Anion gap 8 5 - 15  CBC     Status: Abnormal   Collection Time: 12/16/16  1:27 PM  Result Value Ref Range   WBC 4.6 3.8 - 10.6 K/uL   RBC 4.13 (L) 4.40 - 5.90 MIL/uL   Hemoglobin 13.0 13.0 - 18.0 g/dL   HCT 38.0 (L) 40.0 - 52.0 %   MCV 92.0 80.0 - 100.0 fL   MCH 31.6 26.0 - 34.0 pg   MCHC 34.3 32.0 -   36.0 g/dL   RDW 12.9 11.5 - 14.5 %   Platelets 154 150 - 440 K/uL  Urinalysis, Complete w Microscopic     Status: Abnormal   Collection Time: 12/16/16  1:28 PM  Result Value Ref Range   Color, Urine YELLOW (A) YELLOW   APPearance CLEAR (A) CLEAR   Specific Gravity, Urine 1.017 1.005 - 1.030   pH 6.0 5.0 - 8.0   Glucose, UA NEGATIVE NEGATIVE mg/dL   Hgb urine dipstick NEGATIVE NEGATIVE   Bilirubin Urine NEGATIVE NEGATIVE   Ketones, ur 5 (A) NEGATIVE mg/dL   Protein, ur 30 (A) NEGATIVE mg/dL   Nitrite NEGATIVE NEGATIVE   Leukocytes, UA NEGATIVE NEGATIVE   RBC / HPF 0-5 0 - 5 RBC/hpf   WBC, UA 0-5 0 - 5 WBC/hpf   Bacteria, UA NONE SEEN NONE SEEN   Squamous Epithelial / LPF NONE SEEN NONE SEEN   Mucus PRESENT   Lactic acid, plasma     Status: None   Collection Time: 12/16/16  7:31 PM    Result Value Ref Range   Lactic Acid, Venous 1.2 0.5 - 1.9 mmol/L    Assessment/Plan:  Essential hypertension blood pressure control important in reducing the progression of atherosclerotic disease. On appropriate oral medications.   RLQ abdominal pain Not likely from vascular disease.  Maybe from shingles  SMA stenosis (HCC) There appeared to be SMA stenosis on his CT scan does not have any visceral ischemic symptoms. At this point, we will not plan any further workup or evaluation unless his symptoms worsen  Aortic atherosclerosis ([place]) The patient has aortic and branch vessel atherosclerosis on a CT scan which I have independently reviewed. He does not currently have any worrisome symptoms from this. I have recommended that he take an aspirin a day and continue to be active with exercise. We have discussed the role of abstinence from tobacco and healthy eating. I would plan to recheck this in 6 months with noninvasive studies or sooner if problems develop in the interim.      Leotis Pain 01/22/2017, 9:41 AM   This note was created with Dragon medical transcription system.  Any errors from dictation are unintentional.

## 2017-01-22 NOTE — Patient Instructions (Signed)

## 2017-01-22 NOTE — Assessment & Plan Note (Signed)
Not likely from vascular disease.  Maybe from shingles

## 2017-01-22 NOTE — Assessment & Plan Note (Signed)
The patient has aortic and branch vessel atherosclerosis on a CT scan which I have independently reviewed. He does not currently have any worrisome symptoms from this. I have recommended that he take an aspirin a day and continue to be active with exercise. We have discussed the role of abstinence from tobacco and healthy eating. I would plan to recheck this in 6 months with noninvasive studies or sooner if problems develop in the interim.

## 2017-01-22 NOTE — Assessment & Plan Note (Signed)
There appeared to be SMA stenosis on his CT scan does not have any visceral ischemic symptoms. At this point, we will not plan any further workup or evaluation unless his symptoms worsen

## 2017-01-22 NOTE — Assessment & Plan Note (Signed)
blood pressure control important in reducing the progression of atherosclerotic disease. On appropriate oral medications.  

## 2017-01-30 ENCOUNTER — Encounter: Payer: Self-pay | Admitting: *Deleted

## 2017-01-30 ENCOUNTER — Encounter: Payer: Self-pay | Admitting: Anesthesiology

## 2017-02-04 ENCOUNTER — Telehealth: Payer: Self-pay

## 2017-02-04 NOTE — Telephone Encounter (Signed)
Patients daughter Peter Congo called stated that her father's stomach pain has went away and he  Doesn't feel he needs to have colonoscopy.  After he was seen by [place] he was diagnosed with shingles vaccine.  He is doing much better and after receiving med for shingles his stomach pain went away.  So he does not want to have colonoscopy as scheduled 02/06/17  [place]. Kim at [facility] has been informed. Thanks [employer]

## 2017-02-06 ENCOUNTER — Ambulatory Visit: Admission: RE | Admit: 2017-02-06 | Payer: Medicare HMO | Source: Ambulatory Visit | Admitting: Gastroenterology

## 2017-02-06 HISTORY — DX: Dizziness and giddiness: R42

## 2017-02-06 HISTORY — DX: Psoriasis, unspecified: L40.9

## 2017-02-06 HISTORY — DX: Presence of dental prosthetic device (complete) (partial): Z97.2

## 2017-02-06 SURGERY — COLONOSCOPY WITH PROPOFOL
Anesthesia: Choice

## 2017-02-20 ENCOUNTER — Telehealth: Payer: Self-pay

## 2017-02-20 ENCOUNTER — Ambulatory Visit: Payer: Self-pay | Admitting: Surgery

## 2017-02-20 NOTE — Telephone Encounter (Signed)
I had sent myself a staff message to check on patient's status. Patient came to see Dr. Burt Knack on 12/13/2016 for  consult and after his exam, his recommendation was to see gastroenterologist and a vein and vascular doctor. I saw that the patient went to the ED again on 12/16/2016 for his Right Lower Quadrant again. On 01/17/2017 he then saw Dr. Marius Ditch and she recommended for him to have a Colonoscopy done on 02/06/2017. However, the patient's daughter called Dr. Verlin Grills office to cancel his procedure (colonoscopy) since he was no longer having the abdominal pain. Patient also saw Dr. Lucky Cowboy on 01/22/2017 and recommended for the patient to take Aspirin as well as his blood pressure medications. Dr. Lucky Cowboy is to follow up with patient in 6 months. I then informed Dr. Burt Knack about patient and what has happened. Since patient's daughter stated that he was doing better, then it was no need to see him back unless he calls [place] again.

## 2017-07-23 ENCOUNTER — Encounter (INDEPENDENT_AMBULATORY_CARE_PROVIDER_SITE_OTHER): Payer: Medicare HMO

## 2017-07-23 ENCOUNTER — Ambulatory Visit (INDEPENDENT_AMBULATORY_CARE_PROVIDER_SITE_OTHER): Payer: Medicare HMO | Admitting: Vascular Surgery

## 2018-10-16 IMAGING — CT CT ABD-PELV W/ CM
2 of 5 series · 15 of 46 positions shown, 17 images · IV contrast (APPLIED)
Comparison: None.

CLINICAL DATA: Sharp left upper quadrant abdominal pain for 3 days.
Pain has now moved to the right side. No nausea, vomiting or
diarrhea.

EXAM:
CT ABDOMEN AND PELVIS WITH CONTRAST
TECHNIQUE: Multidetector CT imaging of the abdomen and pelvis was performed
using the standard protocol following bolus administration of
intravenous contrast.
CONTRAST:  100mL 6RCLNQ-L00 IOPAMIDOL (6RCLNQ-L00) INJECTION 61%

[Series 2: routine abd/pel with · axial · 0.81mm/px · z∈[-483,-43]mm · 12 of 100 slices shown, 14 images]
[im 6/100  soft-tissue]
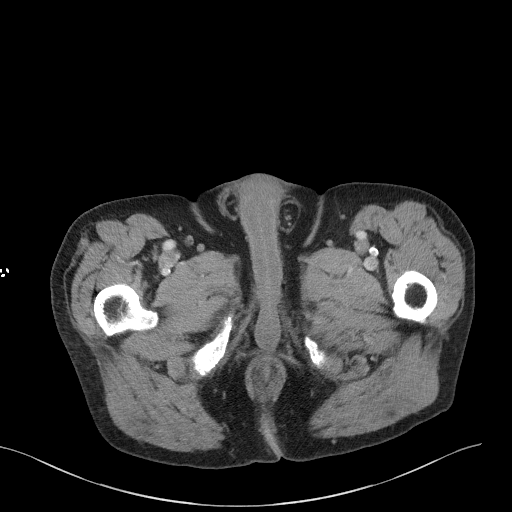
[im 6/100  bone]
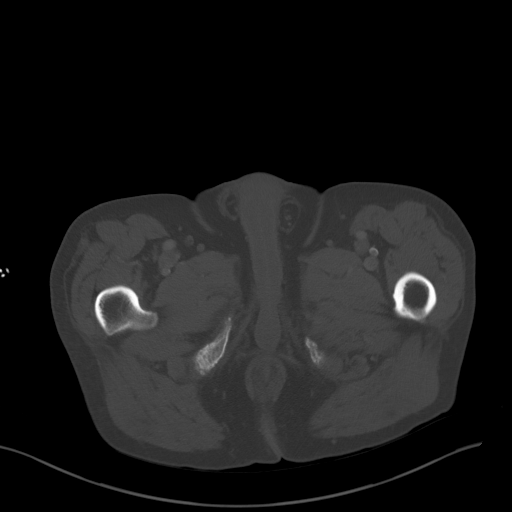
[im 16/100  soft-tissue]
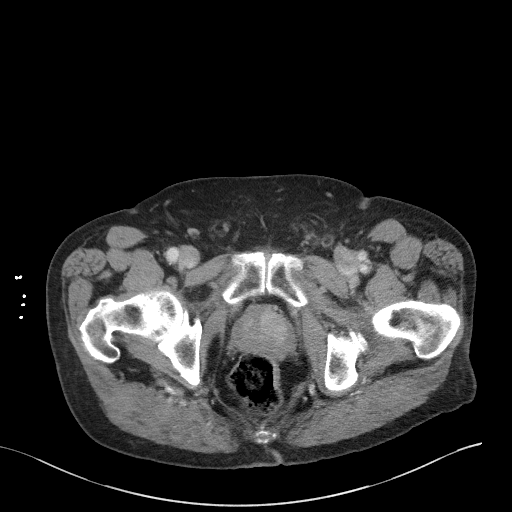
[im 21/100  soft-tissue]
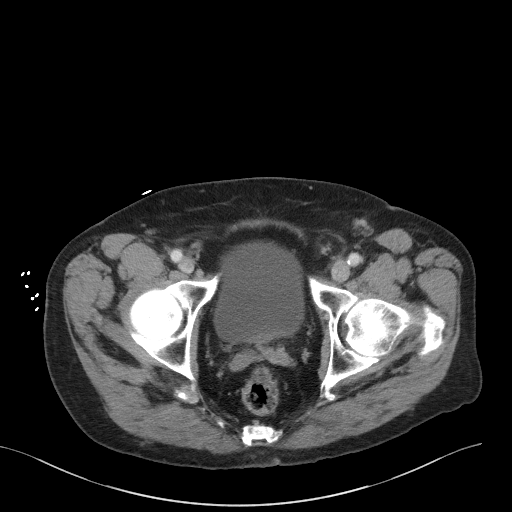
[im 32/100  soft-tissue]
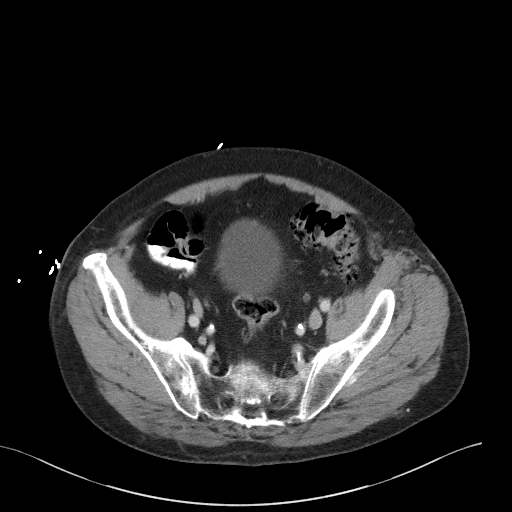
[im 37/100  soft-tissue]
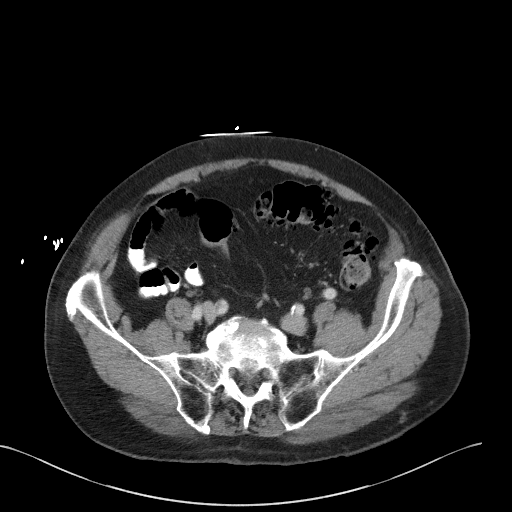
[im 47/100  soft-tissue]
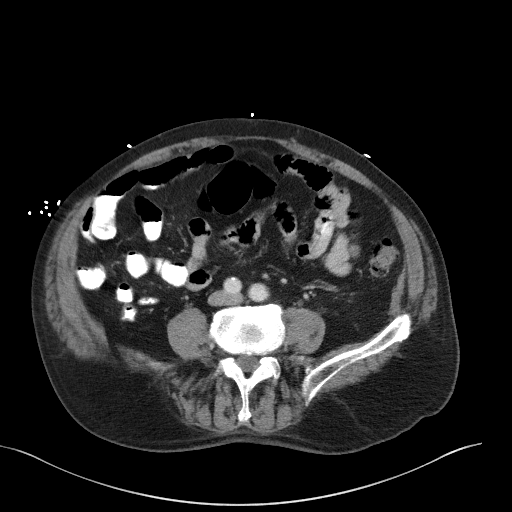
[im 53/100  soft-tissue]
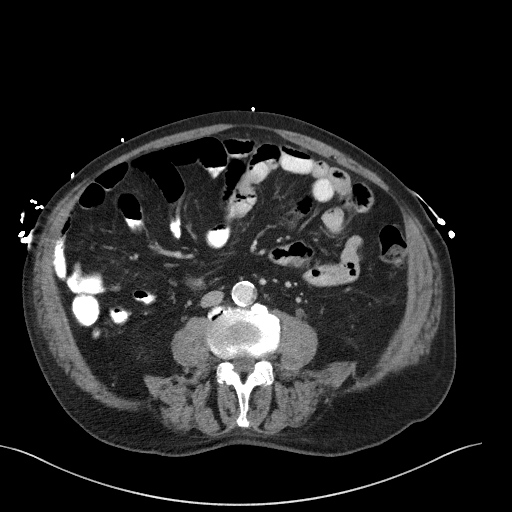
[im 63/100  soft-tissue]
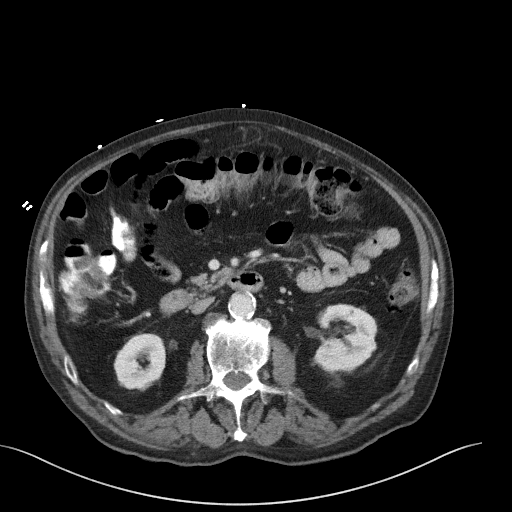
[im 68/100  soft-tissue]
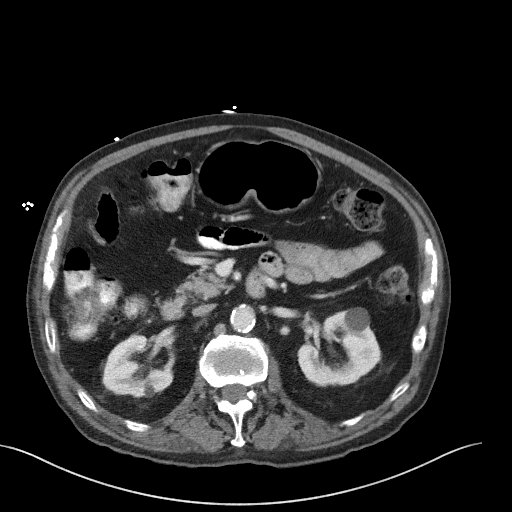
[im 68/100  bone]
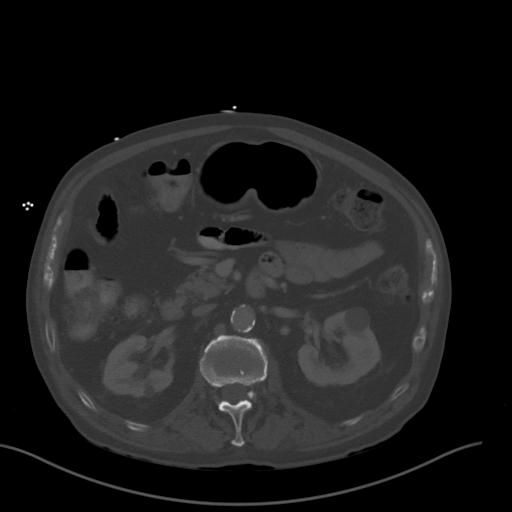
[im 79/100  soft-tissue]
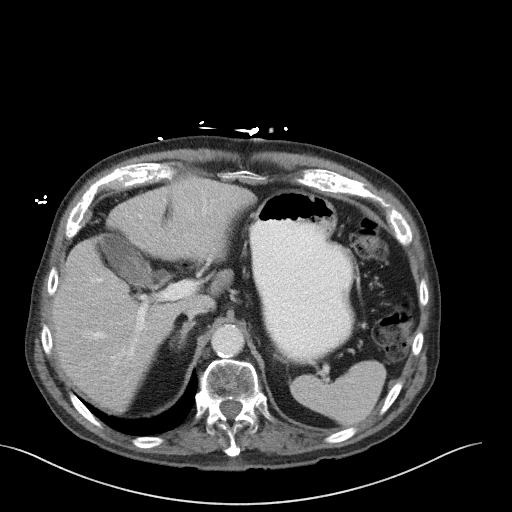
[im 84/100  soft-tissue]
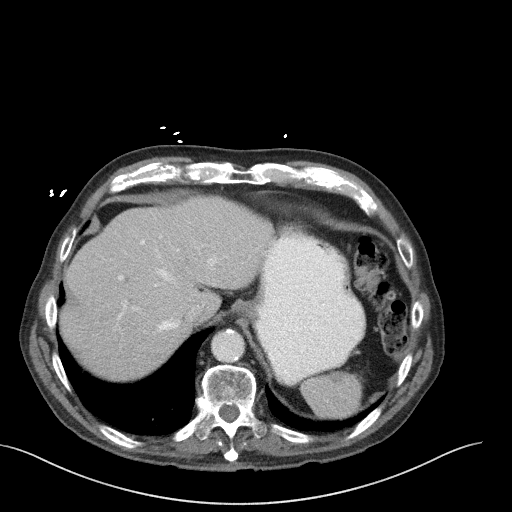
[im 94/100  soft-tissue]
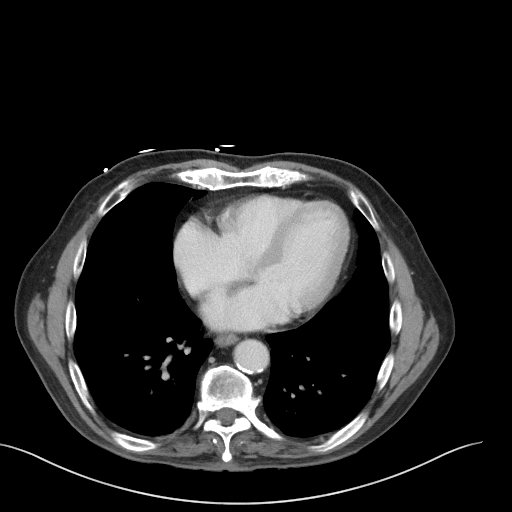

[Series 5: coronal st · coronal · 0.72mm/px · 3 of 97 slices shown]
[im 33/97  soft-tissue]
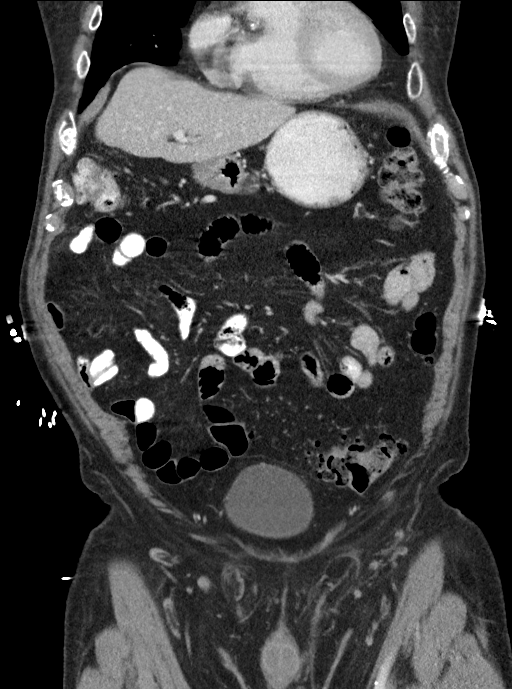
[im 43/97  soft-tissue]
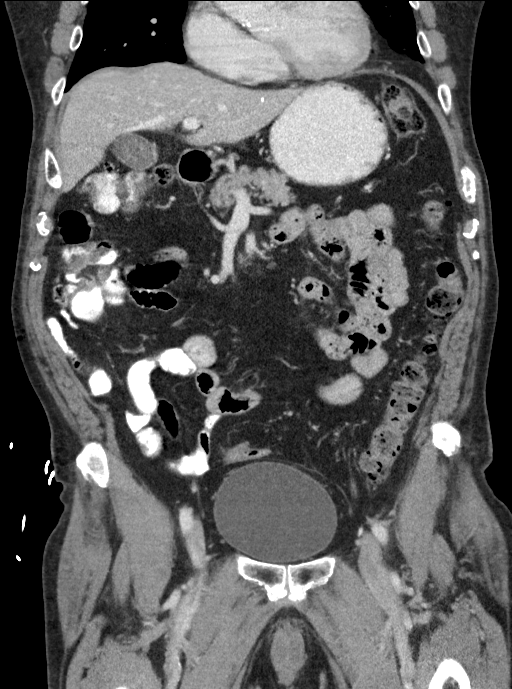
[im 54/97  soft-tissue]
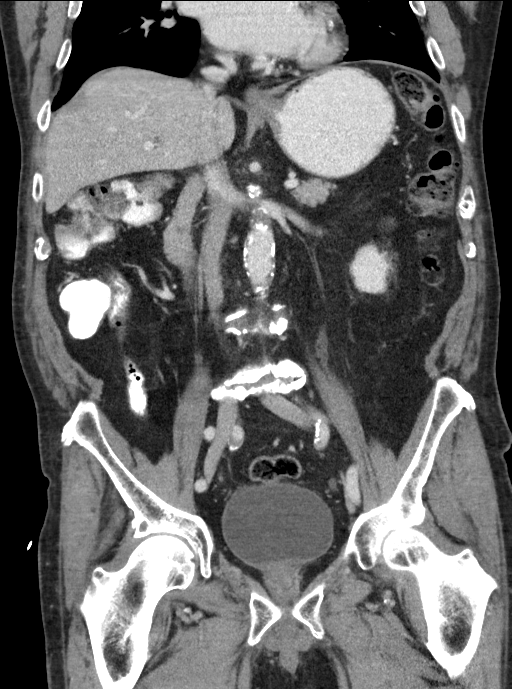

[15 of 46 positions shown; findings below may reference images not displayed]

FINDINGS: Lower chest: Clear lung bases. There is no pleural or pericardial
effusion. There is atherosclerosis of the aorta and coronary
arteries. There are probable aortic valvular calcifications.

Hepatobiliary: The liver is normal in density without focal
abnormality. There are multiple small partially calcified
gallstones. No evidence of gallbladder wall thickening, surrounding
inflammation or biliary dilatation.

Pancreas: Unremarkable. No pancreatic ductal dilatation or
surrounding inflammatory changes.

Spleen: Normal in size without focal abnormality.

Adrenals/Urinary Tract: Both adrenal glands appear normal. No
evidence of urinary tract calculus, hydronephrosis or asymmetric
perinephric soft tissue stranding. There is a 1.9 cm cyst anteriorly
in the mid left kidney. There are additional tiny low-density renal
lesions bilaterally, too small to characterize, but likely cysts as
well. No worrisome renal findings. The bladder appears unremarkable.

Stomach/Bowel: The proximal stomach is well distended with contrast.
There is luminal narrowing of the pylorus which is less evident on
the delayed images, probably due to peristalsis. No small bowel wall
thickening or distention is seen. The appendix appears normal. There
are diverticular changes throughout the colon without surrounding
inflammation. Colonic evaluation is limited by incomplete distension
and colonic stool. There is a fatty ileocecal valve.

Vascular/Lymphatic: There are no enlarged abdominal or pelvic lymph
nodes. There is fairly extensive aortic and branch vessel
atherosclerosis. There is luminal stenosis of the superior
mesenteric artery. No evidence of large vessel occlusion.

Reproductive: The prostate gland is mildly enlarged. There is
asymmetric density centrally in the left lobe measuring up to 2.5 cm
on image 84. The seminal vesicles appear unremarkable.

Other: Small umbilical hernia containing only fat. No ascites or
free air.

Musculoskeletal: No acute or significant osseous findings. There is
ankylosis of the spine and sacroiliac joints.
IMPRESSION: 1. No clear acute findings or explanation for the patient's
symptoms.
2. Difficult to exclude distal gastric wall thickening, although
this may be secondary to peristalsis.
3. Colonic diverticulosis without evidence of acute inflammation.
The appendix appears normal.
4. Aortic Atherosclerosis ([id number]). Luminal stenosis of the
superior mesenteric artery.
5. Asymmetric central enlargement of the left lobe of the prostate
gland, nonspecific. Central location favors BPH. Correlation with
PSA recommended.
6. Ankylosing spondylitis.

## 2021-11-29 ENCOUNTER — Emergency Department: Payer: Medicare HMO

## 2021-11-29 ENCOUNTER — Other Ambulatory Visit: Payer: Self-pay

## 2021-11-29 ENCOUNTER — Inpatient Hospital Stay
Admission: EM | Admit: 2021-11-29 | Discharge: 2021-11-30 | DRG: 309 | Disposition: A | Discharge status: Other Acute Inpatient Hospital | Payer: Medicare HMO | Attending: Internal Medicine | Admitting: Internal Medicine

## 2021-11-29 DIAGNOSIS — I440 Atrioventricular block, first degree: Principal | ICD-10-CM | POA: Diagnosis present

## 2021-11-29 DIAGNOSIS — R42 Dizziness and giddiness: Secondary | ICD-10-CM | POA: Diagnosis present

## 2021-11-29 DIAGNOSIS — I10 Essential (primary) hypertension: Secondary | ICD-10-CM | POA: Diagnosis present

## 2021-11-29 DIAGNOSIS — R339 Retention of urine, unspecified: Secondary | ICD-10-CM | POA: Diagnosis present

## 2021-11-29 DIAGNOSIS — Z923 Personal history of irradiation: Secondary | ICD-10-CM | POA: Diagnosis not present

## 2021-11-29 DIAGNOSIS — L409 Psoriasis, unspecified: Secondary | ICD-10-CM | POA: Diagnosis present

## 2021-11-29 DIAGNOSIS — Z8619 Personal history of other infectious and parasitic diseases: Secondary | ICD-10-CM

## 2021-11-29 DIAGNOSIS — R7989 Other specified abnormal findings of blood chemistry: Secondary | ICD-10-CM | POA: Diagnosis present

## 2021-11-29 DIAGNOSIS — Z8249 Family history of ischemic heart disease and other diseases of the circulatory system: Secondary | ICD-10-CM | POA: Diagnosis not present

## 2021-11-29 DIAGNOSIS — K551 Chronic vascular disorders of intestine: Secondary | ICD-10-CM | POA: Diagnosis present

## 2021-11-29 DIAGNOSIS — D61818 Other pancytopenia: Secondary | ICD-10-CM | POA: Diagnosis present

## 2021-11-29 DIAGNOSIS — R531 Weakness: Principal | ICD-10-CM

## 2021-11-29 DIAGNOSIS — K2970 Gastritis, unspecified, without bleeding: Secondary | ICD-10-CM | POA: Diagnosis present

## 2021-11-29 DIAGNOSIS — E785 Hyperlipidemia, unspecified: Secondary | ICD-10-CM | POA: Diagnosis present

## 2021-11-29 DIAGNOSIS — I495 Sick sinus syndrome: Secondary | ICD-10-CM | POA: Diagnosis present

## 2021-11-29 DIAGNOSIS — R338 Other retention of urine: Secondary | ICD-10-CM | POA: Diagnosis not present

## 2021-11-29 DIAGNOSIS — R778 Other specified abnormalities of plasma proteins: Secondary | ICD-10-CM | POA: Diagnosis present

## 2021-11-29 DIAGNOSIS — R001 Bradycardia, unspecified: Secondary | ICD-10-CM | POA: Diagnosis present

## 2021-11-29 DIAGNOSIS — I248 Other forms of acute ischemic heart disease: Secondary | ICD-10-CM | POA: Diagnosis present

## 2021-11-29 DIAGNOSIS — Z8546 Personal history of malignant neoplasm of prostate: Secondary | ICD-10-CM | POA: Diagnosis not present

## 2021-11-29 DIAGNOSIS — Z79899 Other long term (current) drug therapy: Secondary | ICD-10-CM | POA: Diagnosis not present

## 2021-11-29 LAB — BASIC METABOLIC PANEL
Anion gap: 5 (ref 5–15)
BUN: 18 mg/dL (ref 8–23)
CO2: 29 mmol/L (ref 22–32)
Calcium: 9.3 mg/dL (ref 8.9–10.3)
Chloride: 101 mmol/L (ref 98–111)
Creatinine, Ser: 0.98 mg/dL (ref 0.61–1.24)
GFR, Estimated: >60 mL/min (ref >60)
Glucose, Bld: 124 mg/dL — ABNORMAL HIGH (ref 70–99)
Potassium: 4.3 mmol/L (ref 3.5–5.1)
Sodium: 135 mmol/L (ref 135–145)

## 2021-11-29 LAB — PROCALCITONIN: Procalcitonin: <0.1 ng/mL

## 2021-11-29 LAB — CBC
HCT: 32.8 % — ABNORMAL LOW (ref 39.0–52.0)
Hemoglobin: 11.2 g/dL — ABNORMAL LOW (ref 13.0–17.0)
MCH: 31.8 pg (ref 26.0–34.0)
MCHC: 34.1 g/dL (ref 30.0–36.0)
MCV: 93.2 fL (ref 80.0–100.0)
Platelets: 132 10*3/uL — ABNORMAL LOW (ref 150–400)
RBC: 3.52 MIL/uL — ABNORMAL LOW (ref 4.22–5.81)
RDW: 12.7 % (ref 11.5–15.5)
WBC: 3 10*3/uL — ABNORMAL LOW (ref 4.0–10.5)
nRBC: 0 % (ref 0.0–0.2)

## 2021-11-29 LAB — MAGNESIUM: Magnesium: 2.2 mg/dL (ref 1.7–2.4)

## 2021-11-29 LAB — TROPONIN I (HIGH SENSITIVITY)
Troponin I (High Sensitivity): 100 ng/L (ref <18)
Troponin I (High Sensitivity): 95 ng/L — ABNORMAL HIGH (ref <18)

## 2021-11-29 LAB — T4, FREE: Free T4: 0.79 ng/dL (ref 0.61–1.12)

## 2021-11-29 LAB — TSH: TSH: 4.351 u[IU]/mL (ref 0.350–4.500)

## 2021-11-29 LAB — BRAIN NATRIURETIC PEPTIDE: B Natriuretic Peptide: 51.8 pg/mL (ref 0.0–100.0)

## 2021-11-29 MED ORDER — ASPIRIN 81 MG PO TBEC
81.0000 mg | DELAYED_RELEASE_TABLET | Freq: Every day | ORAL | Status: DC
Start: 2021-11-30 — End: 2021-11-30
  Administered 2021-11-30: 81 mg via ORAL
  Filled 2021-11-29: qty 1

## 2021-11-29 MED ORDER — ATROPINE SULFATE 1 MG/10ML IJ SOSY
PREFILLED_SYRINGE | INTRAMUSCULAR | Status: AC
  Administered 2021-11-29: 1 mg via INTRAVENOUS
  Filled 2021-11-29: qty 10

## 2021-11-29 MED ORDER — ATROPINE SULFATE 1 MG/10ML IJ SOSY
PREFILLED_SYRINGE | INTRAMUSCULAR | Status: AC
  Filled 2021-11-29: qty 10

## 2021-11-29 MED ORDER — ACETAMINOPHEN 650 MG RE SUPP: 650.0000 mg | Freq: Four times a day (QID) | RECTAL | Status: DC | PRN

## 2021-11-29 MED ORDER — SODIUM CHLORIDE 0.9 % IV SOLN
INTRAVENOUS | Status: DC
Start: 2021-11-29 — End: 2021-11-30

## 2021-11-29 MED ORDER — SODIUM CHLORIDE 0.9% FLUSH
3.0000 mL | Freq: Two times a day (BID) | INTRAVENOUS | Status: DC
  Administered 2021-11-30 (×2): 3 mL via INTRAVENOUS

## 2021-11-29 MED ORDER — ACETAMINOPHEN 325 MG PO TABS
650.0000 mg | ORAL_TABLET | Freq: Four times a day (QID) | ORAL | Status: DC | PRN
  Administered 2021-11-30: 650 mg via ORAL
  Filled 2021-11-29: qty 2

## 2021-11-29 MED ORDER — ATROPINE SULFATE 1 MG/10ML IJ SOSY
1.0000 mg | PREFILLED_SYRINGE | INTRAMUSCULAR | Status: DC | PRN
  Administered 2021-11-30: 1 mg via INTRAVENOUS

## 2021-11-29 MED ORDER — HEPARIN SODIUM (PORCINE) 5000 UNIT/ML IJ SOLN
5000.0000 [IU] | Freq: Three times a day (TID) | INTRAMUSCULAR | Status: DC
Start: 2021-11-29 — End: 2021-11-30
  Administered 2021-11-29 – 2021-11-30 (×3): 5000 [IU] via SUBCUTANEOUS
  Filled 2021-11-29 (×3): qty 1

## 2021-11-29 MED ORDER — HYDRALAZINE HCL 20 MG/ML IJ SOLN: 10.0000 mg | Freq: Three times a day (TID) | INTRAMUSCULAR | Status: DC | PRN

## 2021-11-29 MED ORDER — HYDRALAZINE HCL 10 MG PO TABS
10.0000 mg | ORAL_TABLET | Freq: Two times a day (BID) | ORAL | Status: DC
  Administered 2021-11-30: 10 mg via ORAL
  Filled 2021-11-29 (×2): qty 1

## 2021-11-29 MED ORDER — PANTOPRAZOLE SODIUM 40 MG IV SOLR
40.0000 mg | Freq: Two times a day (BID) | INTRAVENOUS | Status: DC
  Administered 2021-11-29 – 2021-11-30 (×2): 40 mg via INTRAVENOUS
  Filled 2021-11-29 (×2): qty 10

## 2021-11-29 MED ORDER — ASPIRIN 81 MG PO CHEW
324.0000 mg | CHEWABLE_TABLET | Freq: Once | ORAL | Status: AC
  Administered 2021-11-29: 324 mg via ORAL
  Filled 2021-11-29: qty 4

## 2021-11-29 MED ORDER — ATORVASTATIN CALCIUM 20 MG PO TABS
20.0000 mg | ORAL_TABLET | Freq: Every day | ORAL | Status: DC
  Administered 2021-11-30: 20 mg via ORAL
  Filled 2021-11-29: qty 1

## 2021-11-29 NOTE — Assessment & Plan Note (Addendum)
Vitals:   11/29/21 1921 11/29/21 2041 11/29/21 2100 11/29/21 2115  BP: (!) 173/53 (!) 179/57 (!) 151/56 (!) 164/55   11/29/21 2130 11/29/21 2200 11/29/21 2230 11/29/21 2300  BP: (!) 168/55 (!) 161/52 (!) 163/51 (!) 159/63  Orthostatic blood pressure. Attribute patient's dizziness to his underlying sinus bradycardia and AV block.  [facility] to transfer to [facility] for EP eval and pacemaker placement likely

## 2021-11-29 NOTE — H&P (Signed)
History and Physical    Shane Vazquez QQI:[id number] DOB: 01-21-1930 DOA: 11/29/2021  PCP: Patient, No Pcp Per    Patient coming from:  Home    Chief Complaint:  Dizziness.    HPI:  Shane Vazquez is a 88 y.o. male seen in ed with complaints of dizziness x 1 year.  Duration: 1 year .HPI is limited due to age.  Frequency: Intermittent daily.   Location: N/A.   Quality:N/A.  Rate: N/A   Radiation:N/A   Aggravating: N/A  Alleviating:N/A  Associated factors: N/A.  Treatment tried: Lets it pass.   Pt has past medical history of Htn . Pt is bradycardic during my exam and is given atropine.  Pt reports leg weakness  and generalized weakness.  ED Course:   Vitals:   11/29/21 2200 11/29/21 2230 11/29/21 2300 11/29/21 2321  BP: (!) 161/52 (!) 163/51 (!) 159/63   Pulse: (!) 51 (!) 49 60   Resp: '13 13 17   '$ Temp:    97.8 F (36.6 C)  TempSrc:    Oral  SpO2: 98% 100% 100%   Weight:      Height:      In ed granddaughter at bedside translating and pt  is not able to give good history.  BMP shows glucose of 124 otherwise within normal limits. BNP of 51.8 Troponin of 100, second set 95 Procalcitonin less than 10 CBC shows white count of 3.0, hemoglobin of 11.2, platelets of 132-overall mild pancytopenia. TSH of 4.351/free T4 of 0.79 First EKG shows: Sinus bradycardia 50 with a PR of 232 first-degree AV block, QTc of 393 see image below.  Second EKG shows: Sinus bradycardia 50 PR interval of 240 and ST elevation in anterior lead which were reviewed with cardiology on-call by ED provider And felt to be not a STEMI.  See image below    Review of Systems:  Review of Systems  Neurological:  Positive for dizziness.  All other systems reviewed and are negative.  Past Medical History:  Diagnosis Date   Hypertension    Psoriasis    Shingles    Vertigo    Wears dentures    full upper and lower    Past Surgical History:  Procedure Laterality Date    ESOPHAGOGASTRODUODENOSCOPY     many yrs ago     reports that he has never smoked. He has never used smokeless tobacco. He reports that he does not drink alcohol and does not use drugs.  No Known Allergies  Family History  Problem Relation Age of Onset   Stomach cancer Mother    Heart disease Father     Prior to Admission medications   Medication Sig Start Date End Date Taking? Authorizing Provider  amLODipine (NORVASC) 5 MG tablet Take 1 tablet by mouth daily. 09/10/18  Yes [provider]  atorvastatin (LIPITOR) 20 MG tablet TAKE 1 TABLET BY MOUTH EVERY DAY AT NIGHT 08/04/18  Yes [provider]  meclizine (ANTIVERT) 12.5 MG tablet Take by mouth. 07/31/17  Yes [provider]  Olopatadine HCl (PAZEO) 0.7 % SOLN Place 1 drop into both eyes daily as needed. 01/17/18  Yes [provider]  gabapentin (NEURONTIN) 300 MG capsule TAKE ONE CAPSULE BY MOUTH 3 TIMES A DAY AS NEEDED 12/25/16   [provider]  ibuprofen (ADVIL,MOTRIN) 200 MG tablet Take 200 mg by mouth every 6 (six) hours as needed.    [provider]  losartan (COZAAR) 100 MG tablet Take 100 mg by  mouth daily.    [provider]  Multiple Vitamin (MULTIVITAMIN ADULT) TABS Take by mouth.    [provider]    Physical Exam: Vitals:   11/29/21 2200 11/29/21 2230 11/29/21 2300 11/29/21 2321  BP: (!) 161/52 (!) 163/51 (!) 159/63   Pulse: (!) 51 (!) 49 60   Resp: '13 13 17   '$ Temp:    97.8 F (36.6 C)  TempSrc:    Oral  SpO2: 98% 100% 100%   Weight:      Height:       Physical Exam Vitals and nursing note reviewed.  Constitutional:      General: He is not in acute distress.    Appearance: Normal appearance. He is not ill-appearing, toxic-appearing or diaphoretic.  HENT:     Head: Normocephalic and atraumatic.     Right Ear: Hearing and external ear normal.     Left Ear: Hearing and external ear normal.     Nose: Nose normal. No nasal deformity.      Mouth/Throat:     Lips: Pink.     Mouth: Mucous membranes are moist.     Tongue: No lesions.     Pharynx: Oropharynx is clear.  Eyes:     Extraocular Movements: Extraocular movements intact.     Pupils: Pupils are equal, round, and reactive to light.  Neck:     Vascular: No carotid bruit.  Cardiovascular:     Rate and Rhythm: Normal rate and regular rhythm.     Pulses: Normal pulses.     Heart sounds: Normal heart sounds.  Pulmonary:     Effort: Pulmonary effort is normal.     Breath sounds: Normal breath sounds.  Abdominal:     General: Bowel sounds are normal. There is no distension.     Palpations: Abdomen is soft. There is no mass.     Tenderness: There is no abdominal tenderness. There is no guarding.     Hernia: No hernia is present.  Musculoskeletal:     Right lower leg: No edema.     Left lower leg: No edema.  Skin:    General: Skin is warm.  Neurological:     General: No focal deficit present.     Mental Status: He is alert and oriented to person, place, and time.     Cranial Nerves: Cranial nerves 2-12 are intact.     Motor: Motor function is intact.  Psychiatric:        Attention and Perception: Attention normal.        Mood and Affect: Mood normal.        Speech: Speech normal.        Behavior: Behavior normal. Behavior is cooperative.        Cognition and Memory: Cognition normal.      Labs on Admission: I have personally reviewed following labs and imaging studies BMET Recent Labs  Lab 11/29/21 1927  NA 135  K 4.3  CL 101  CO2 29  BUN 18  CREATININE 0.98  GLUCOSE 124*   Electrolytes Recent Labs  Lab 11/29/21 1927  CALCIUM 9.3  MG 2.2   Sepsis Markers Recent Labs  Lab 11/29/21 1927  PROCALCITON <0.10   ABG No results for input(s): "PHART", "PCO2ART", "PO2ART" in the last 168 hours. Liver Enzymes No results for input(s): "AST", "ALT", "ALKPHOS", "BILITOT", "ALBUMIN" in the last 168 hours. Cardiac Enzymes No results for input(s):  "TROPONINI", "PROBNP" in the last 168 hours. No results  found for: "DDIMER" Coag's No results for input(s): "APTT", "INR" in the last 168 hours.  No results found for this or any previous visit (from the past 240 hour(s)).   Current Facility-Administered Medications:    0.9 %  sodium chloride infusion, , Intravenous, Continuous, Posey Pronto, Gretta Cool, MD   acetaminophen (TYLENOL) tablet 650 mg, 650 mg, Oral, Q6H PRN **OR** acetaminophen (TYLENOL) suppository 650 mg, 650 mg, Rectal, Q6H PRN, Para Skeans, MD   [START ON 11/30/2021] aspirin EC tablet 81 mg, 81 mg, Oral, Daily, Para Skeans, MD   [START ON 11/30/2021] atorvastatin (LIPITOR) tablet 20 mg, 20 mg, Oral, Daily, Makensie Mulhall V, MD   atropine 1 MG/10ML injection 1 mg, 1 mg, Intravenous, Q5 Min x 3 PRN, Florina Ou V, MD, 1 mg at 11/29/21 2252   atropine 1 MG/10ML injection, , , ,    heparin injection 5,000 Units, 5,000 Units, Subcutaneous, Q8H, Florina Ou V, MD, 5,000 Units at 11/29/21 2316   hydrALAZINE (APRESOLINE) injection 10 mg, 10 mg, Intravenous, Q8H PRN, Para Skeans, MD   [START ON 11/30/2021] hydrALAZINE (APRESOLINE) tablet 10 mg, 10 mg, Oral, Q12H, Mckennon Zwart V, MD   pantoprazole (PROTONIX) injection 40 mg, 40 mg, Intravenous, Q12H, Florina Ou V, MD, 40 mg at 11/29/21 2315   sodium chloride flush (NS) 0.9 % injection 3 mL, 3 mL, Intravenous, Q12H, Para Skeans, MD  Current Outpatient Medications:    amLODipine (NORVASC) 5 MG tablet, Take 1 tablet by mouth daily., Disp: , Rfl:    atorvastatin (LIPITOR) 20 MG tablet, TAKE 1 TABLET BY MOUTH EVERY DAY AT NIGHT, Disp: , Rfl:    Multiple Vitamin (MULTIVITAMIN ADULT) TABS, Take 1 tablet by mouth daily., Disp: , Rfl:    Olopatadine HCl (PAZEO) 0.7 % SOLN, Place 1 drop into both eyes daily as needed., Disp: , Rfl:    gabapentin (NEURONTIN) 300 MG capsule, TAKE ONE CAPSULE BY MOUTH 3 TIMES A DAY AS NEEDED, Disp: , Rfl: 0   ibuprofen (ADVIL,MOTRIN) 200 MG tablet, Take 200 mg by mouth  every 6 (six) hours as needed., Disp: , Rfl:    losartan (COZAAR) 100 MG tablet, Take 100 mg by mouth daily., Disp: , Rfl:   COVID-19 Labs No results for input(s): "DDIMER", "FERRITIN", "LDH", "CRP" in the last 72 hours. No results found for: "SARSCOV2NAA"  Radiological Exams on Admission: DG Chest 2 View  Result Date: 11/29/2021 CLINICAL DATA:  Dizziness EXAM: CHEST - 2 VIEW COMPARISON:  None Available. FINDINGS: Cardiac size is within normal limits. There are no signs of pulmonary edema. There is peribronchial thickening. Small patchy infiltrate is seen in medial left lower lung field seen only on the PA view. There is no pleural effusion or pneumothorax. IMPRESSION: There are no signs of alveolar pulmonary edema. Small infiltrate in the medial left lower lung field may suggest crowding of normal bronchovascular structures or subsegmental atelectasis/pneumonia. Electronically Signed   By: Elmer Picker M.D.   On: 11/29/2021 19:47    EKG: Independently reviewed.  Bradycardia 50's no st t wave changes.    Assessment and Plan: * Dizziness Vitals:   11/29/21 1921 11/29/21 2041 11/29/21 2100 11/29/21 2115  BP: (!) 173/53 (!) 179/57 (!) 151/56 (!) 164/55   11/29/21 2130 11/29/21 2200 11/29/21 2230 11/29/21 2300  BP: (!) 168/55 (!) 161/52 (!) 163/51 (!) 159/63  Orthostatic blood pressure. Attribute patient's dizziness to his underlying sinus bradycardia and AV block. Fall precautions.    Sinus bradycardia Symptomatic  bradycardia causing dizziness requiring atropine. Pacer pads placed on patient in the event that he may require pacing overnight. We will admit patient to stepdown unit consult ICU as deemed appropriate. Thyroid function studies. Iron studies. Cardiology consult [facility] Dr. Golden Hurter a.m. message sent.   Troponin level elevated Suspect from demand ischemia. Cardiology consulted. Echocardiogram and additional studies per cardiology.  Essential  hypertension Patient's home regimen of amlodipine and losartan held. Start patient on hydralazine as patient is bradycardic.  Pancytopenia (HCC) Mild pancytopenia. We will follow. Hematology consult once patient is stable. Anemia panel.  Gastritis History of gastritis gastric thickening noted on imaging study noted on chart review. We will start patient on IV PPI therapy. Heart healthy diet n.p.o. after midnight.    DVT prophylaxis:  Heparin   Code Status:  Full code    Family Communication:  Bently, Wyss (Daughter)  [phone] [facility] Phone)   Disposition Plan:  Home    Consults called:  Dr.Orgel.   Admission status: Inpatient.      Para Skeans MD [facility]  6 PM- 2 AM. Please contact me via secure Chat 6 PM-2 AM. [phone] ( Pager ) To contact the [facility] Attending or Consulting provider [place] or covering provider during after hours [place], for this patient.   Check the care team in [facility] and look for a) attending/consulting [facility] provider listed and b) the [facility] team listed Log into [email or web address] and use [place]'s universal password to access. If you do not have the password, please contact the hospital operator. Locate the [facility] provider you are looking for under [facility] and page to a number that you can be directly reached. If you still have difficulty reaching the provider, please page the [facility] (Director on Call) for the Hospitalists listed on amion for assistance. [email or web address] 11/29/2021, 11:33 PM

## 2021-11-29 NOTE — Assessment & Plan Note (Signed)
Patient's home regimen of amlodipine and losartan held. Start patient on hydralazine as patient is bradycardic.

## 2021-11-29 NOTE — Assessment & Plan Note (Addendum)
Symptomatic bradycardia causing dizziness requiring atropine. Pacer pads placed on patient in the event that he may require pacing overnight.  As above.  Appreciate cardiology help.  Normal thyroid function study

## 2021-11-29 NOTE — Assessment & Plan Note (Addendum)
Suspect from demand ischemia. Cardiology consulted.

## 2021-11-29 NOTE — Assessment & Plan Note (Addendum)
History of gastritis gastric thickening noted on imaging study noted on chart review.  Patient on as needed Motrin at home which I will recommend he discontinue We will start patient on IV PPI therapy. Heart healthy diet n.p.o. after midnight.

## 2021-11-29 NOTE — Assessment & Plan Note (Addendum)
Mild pancytopenia.  Outpatient follow-up with hematology.

## 2021-11-29 NOTE — ED Provider Triage Note (Signed)
Emergency Medicine Provider Triage Evaluation Note  History limited by Spanish language.  Patient's adult granddaughter present for triage, providing HPI.  Shane Vazquez, a 88 y.o. male  was evaluated in triage.  Pt complains of dizziness with position changes.  Presents from local urgent care for evaluation of dizziness and low heart rate.  Patient is an town visiting his granddaughter, for an extended vacation from [place].  Patient without history of heart failure, bradycardia or MI.  Patient without any reports of recent injury, trauma, or falls.  Review of Systems  Positive: Bradycardia, dizziness Negative: CP, SOB  Physical Exam  BP (!) 173/53   Pulse (!) 50   Temp (!) 97.5 F (36.4 C) (Oral)   Resp 18   Ht '5\' 4"'$  (1.626 m)   Wt 64.9 kg   SpO2 100%   BMI 24.55 kg/m  Gen:   Awake, no distress  NAD Resp:  Normal effort CTA MSK:   Moves extremities without difficulty  Other:    Medical Decision Making  Medically screening exam initiated at 7:23 PM.  Appropriate orders placed.  Berley Gambrell was informed that the remainder of the evaluation will be completed by another provider, this initial triage assessment does not replace that evaluation, and the importance of remaining in the ED until their evaluation is complete.  [name] to the ED for evaluation of dizziness.  Patient is found to be bradycardic on exam.   Melvenia Needles, PA-C 11/29/21 1925

## 2021-11-29 NOTE — ED Triage Notes (Signed)
Pt presents to ER with grand-daughter with c/o dizziness and weakness.  Pt states this has been going on for around a week, but grand daughter states he has told her at home it has been going on for longer than this.  Pt has hx of HTN, but denies any hx of other heart problems.  Pt denies any pain.  Pt is A&O x4 and ambulatory to triage.

## 2021-11-29 NOTE — ED Provider Notes (Signed)
[facility] Provider Note    Event Date/Time   First MD Initiated Contact with Patient 11/29/21 2018     (approximate)   History   Dizziness   HPI  Moua Rasmusson is a 88 y.o. male past medical history of hypertension psoriasis SMA stenosis presents with generalized weakness dizziness.  Patient notes that for about a year he has felt generally weak.  Feels like his legs are going to give out when he is walking.  Also feels lightheaded.  Feels fine at rest denies chest pain or exertional dyspnea denies fevers chills cough nausea vomiting diarrhea.  He went to urgent care just today for checkup and they sent him to the emergency department.  No lower extremity swelling no history of heart failure coronary disease.    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      Excl. in [place]?     Most recent vital signs: Vitals:   11/29/21 2115 11/29/21 2130  BP: (!) 164/55 (!) 168/55  Pulse: (!) 51 (!) 49  Resp: (!) 22 13  Temp:    SpO2: 100% 100%     General: Awake, no distress.  CV:  Good peripheral perfusion.  No edema Resp:  Normal effort.  Lungs are clear Abd:  No distention.  Soft nontender throughout Neuro:             Awake, Alert, Oriented x 3  Other:  Aox3, nml speech  PERRL, EOMI, face symmetric, nml tongue  movement  5/5 strength in the BL upper and lower extremities  Sensation grossly intact in the BL upper and lower extremities  Finger-nose-finger intact BL    ED Results / Procedures / Treatments  Labs (all labs ordered are listed, but only abnormal results are displayed) Labs Reviewed  BASIC METABOLIC PANEL - Abnormal; Notable for the following components:      Result Value   Glucose, Bld 124 (*)    All other components within normal limits  CBC - Abnormal; Notable for the following components:   WBC 3.0 (*)    RBC 3.52 (*)    Hemoglobin 11.2 (*)    HCT 32.8 (*)    Platelets 132 (*)    All other components within normal limits  TROPONIN I (HIGH SENSITIVITY) - Abnormal; Notable for the following components:   Troponin I (High Sensitivity) 100 (*)    All other components within normal limits  TROPONIN I (HIGH SENSITIVITY) -  Abnormal; Notable for the following components:   Troponin I (High Sensitivity) 95 (*)    All other components within normal limits  PROCALCITONIN  MAGNESIUM  BRAIN NATRIURETIC PEPTIDE  TSH  T4, FREE     EKG  Initial EKG showing sinus rhythm with normal axis normal intervals question ST elevation in 1 and aVL  RADIOLOGY I reviewed and interpreted the CXR which does not show any acute cardiopulmonary process    PROCEDURES:  Critical Care performed: No  .1-3 Lead EKG Interpretation  Performed by: Rada Hay, MD Authorized by: Rada Hay, MD     Interpretation: abnormal     ECG rate assessment: bradycardic     Rhythm: sinus bradycardia     Ectopy: none     Conduction: abnormal     The patient is on the cardiac monitor to evaluate for evidence of arrhythmia and/or significant heart rate changes.   MEDICATIONS ORDERED IN ED: Medications  aspirin chewable tablet 324 mg (324 mg Oral Given 11/29/21 2044)     IMPRESSION / MDM / ASSESSMENT AND PLAN / ED COURSE  I reviewed the triage vital signs and the nursing notes.                               Patient's presentation is most consistent with acute presentation with potential threat to life or bodily function.  Differential diagnosis includes, but is not limited to, acute coronary syndrome, CHF, pulmonary embolism, pneumonia, deconditioning,  The patient is an 88 year old male presents today for evaluation of 1 year of generalized weakness.  Describes feeling like his legs are running in and out with ambulation denies shortness of breath or chest pain.  Was seen urgent care earlier today and referred to the ED.  He is bradycardic and mildly hypertensive.  He looks quite well is not in any distress on exam and he is neurologically intact.  His initial EKG concern me because of ST elevation in 1 and aVL.  However because he was asymptomatic I discussed with Dr. Kerri Perches STEMI attending who did not feel this met criteria that there could be an extra P wave causing that morphology and on repeat EKG does not look similar.  Notably is 100 and on repeat is 95.  Again patient not having active ischemic symptoms so will not heparinize but given aspirin.  Rest of his labs are overall reassuring he is mildly anemic at 11.2 creatinine normal at 0.9 normal BNP.  Chest x-ray read as possible infiltrate versus vascular crowding.  Patient has no cough dyspnea fevers low suspicion for pneumonia clinically procalcitonin is negative.  Given his symptoms and elevated troponin will admit for further cardiac work-up.     FINAL CLINICAL IMPRESSION(S) / ED DIAGNOSES   Final diagnoses:  Weakness     Rx / DC Orders   ED Discharge Orders     None        Note:  This document was prepared using Dragon voice recognition software and may include unintentional dictation errors.   Rada Hay, MD 11/29/21 2139

## 2021-11-30 ENCOUNTER — Other Ambulatory Visit: Payer: Self-pay

## 2021-11-30 ENCOUNTER — Observation Stay (HOSPITAL_COMMUNITY)
Admission: AD | Admit: 2021-11-30 | Discharge: 2021-12-01 | Disposition: A | Discharge status: Home / Self Care | Payer: Medicare HMO | Source: Other Acute Inpatient Hospital | Attending: Cardiology | Admitting: Cardiology

## 2021-11-30 DIAGNOSIS — I495 Sick sinus syndrome: Secondary | ICD-10-CM | POA: Insufficient documentation

## 2021-11-30 DIAGNOSIS — Z8546 Personal history of malignant neoplasm of prostate: Secondary | ICD-10-CM | POA: Insufficient documentation

## 2021-11-30 DIAGNOSIS — Z79899 Other long term (current) drug therapy: Secondary | ICD-10-CM | POA: Insufficient documentation

## 2021-11-30 DIAGNOSIS — R339 Retention of urine, unspecified: Secondary | ICD-10-CM

## 2021-11-30 DIAGNOSIS — R001 Bradycardia, unspecified: Secondary | ICD-10-CM | POA: Diagnosis present

## 2021-11-30 DIAGNOSIS — I10 Essential (primary) hypertension: Secondary | ICD-10-CM | POA: Insufficient documentation

## 2021-11-30 DIAGNOSIS — R778 Other specified abnormalities of plasma proteins: Secondary | ICD-10-CM | POA: Insufficient documentation

## 2021-11-30 DIAGNOSIS — R42 Dizziness and giddiness: Secondary | ICD-10-CM

## 2021-11-30 DIAGNOSIS — R338 Other retention of urine: Secondary | ICD-10-CM | POA: Clinically undetermined

## 2021-11-30 LAB — COMPREHENSIVE METABOLIC PANEL
ALT: 16 U/L (ref 0–44)
AST: 25 U/L (ref 15–41)
Albumin: 4 g/dL (ref 3.5–5.0)
Alkaline Phosphatase: 74 U/L (ref 38–126)
Anion gap: 4 — ABNORMAL LOW (ref 5–15)
BUN: 16 mg/dL (ref 8–23)
CO2: 28 mmol/L (ref 22–32)
Calcium: 9.2 mg/dL (ref 8.9–10.3)
Chloride: 103 mmol/L (ref 98–111)
Creatinine, Ser: 0.84 mg/dL (ref 0.61–1.24)
GFR, Estimated: >60 mL/min (ref >60)
Glucose, Bld: 99 mg/dL (ref 70–99)
Potassium: 4.1 mmol/L (ref 3.5–5.1)
Sodium: 135 mmol/L (ref 135–145)
Total Bilirubin: 0.9 mg/dL (ref 0.3–1.2)
Total Protein: 6.9 g/dL (ref 6.5–8.1)

## 2021-11-30 LAB — URINALYSIS, COMPLETE (UACMP) WITH MICROSCOPIC
Bacteria, UA: NONE SEEN
Bilirubin Urine: NEGATIVE
Glucose, UA: NEGATIVE mg/dL
Ketones, ur: NEGATIVE mg/dL
Leukocytes,Ua: NEGATIVE
Nitrite: NEGATIVE
Protein, ur: NEGATIVE mg/dL
Specific Gravity, Urine: 1.006 (ref 1.005–1.030)
pH: 8 (ref 5.0–8.0)

## 2021-11-30 LAB — RETICULOCYTES
Immature Retic Fract: 3.9 % (ref 2.3–15.9)
RBC.: 3.88 MIL/uL — ABNORMAL LOW (ref 4.22–5.81)
Retic Count, Absolute: 43.5 10*3/uL (ref 19.0–186.0)
Retic Ct Pct: 1.1 % (ref 0.4–3.1)

## 2021-11-30 LAB — IRON AND TIBC
Iron: 74 ug/dL (ref 45–182)
Saturation Ratios: 25 % (ref 17.9–39.5)
TIBC: 298 ug/dL (ref 250–450)
UIBC: 224 ug/dL

## 2021-11-30 LAB — CBC
HCT: 34.1 % — ABNORMAL LOW (ref 39.0–52.0)
Hemoglobin: 11.7 g/dL — ABNORMAL LOW (ref 13.0–17.0)
MCH: 31.5 pg (ref 26.0–34.0)
MCHC: 34.3 g/dL (ref 30.0–36.0)
MCV: 91.7 fL (ref 80.0–100.0)
Platelets: 137 10*3/uL — ABNORMAL LOW (ref 150–400)
RBC: 3.72 MIL/uL — ABNORMAL LOW (ref 4.22–5.81)
RDW: 12.3 % (ref 11.5–15.5)
WBC: 3 10*3/uL — ABNORMAL LOW (ref 4.0–10.5)
nRBC: 0 % (ref 0.0–0.2)

## 2021-11-30 LAB — FOLATE: Folate: 31 ng/mL (ref >5.9)

## 2021-11-30 LAB — GLUCOSE, CAPILLARY: Glucose-Capillary: 104 mg/dL — ABNORMAL HIGH (ref 70–99)

## 2021-11-30 LAB — FERRITIN: Ferritin: 162 ng/mL (ref 24–336)

## 2021-11-30 LAB — MRSA NEXT GEN BY PCR, NASAL: MRSA by PCR Next Gen: NOT DETECTED

## 2021-11-30 LAB — VITAMIN B12: Vitamin B-12: >7500 pg/mL — ABNORMAL HIGH (ref 180–914)

## 2021-11-30 MED ORDER — PANTOPRAZOLE SODIUM 40 MG IV SOLR
40.0000 mg | Freq: Two times a day (BID) | INTRAVENOUS | Status: DC
  Administered 2021-11-30 – 2021-12-01 (×2): 40 mg via INTRAVENOUS
  Filled 2021-11-30 (×2): qty 10

## 2021-11-30 MED ORDER — NITROGLYCERIN 0.4 MG SL SUBL: 0.4000 mg | SUBLINGUAL_TABLET | SUBLINGUAL | Status: DC | PRN

## 2021-11-30 MED ORDER — TAMSULOSIN HCL 0.4 MG PO CAPS
0.4000 mg | ORAL_CAPSULE | Freq: Every day | ORAL | Status: DC
  Administered 2021-11-30 – 2021-12-01 (×2): 0.4 mg via ORAL
  Filled 2021-11-30 (×2): qty 1

## 2021-11-30 MED ORDER — METHOCARBAMOL 500 MG PO TABS
500.0000 mg | ORAL_TABLET | Freq: Once | ORAL | Status: AC
  Administered 2021-11-30: 500 mg via ORAL
  Filled 2021-11-30: qty 1

## 2021-11-30 MED ORDER — MIRABEGRON ER 25 MG PO TB24
25.0000 mg | ORAL_TABLET | Freq: Every day | ORAL | Status: DC
  Administered 2021-11-30: 25 mg via ORAL
  Filled 2021-11-30 (×2): qty 1

## 2021-11-30 MED ORDER — SENNOSIDES-DOCUSATE SODIUM 8.6-50 MG PO TABS
2.0000 | ORAL_TABLET | Freq: Two times a day (BID) | ORAL | Status: DC
  Administered 2021-11-30: 2 via ORAL
  Filled 2021-11-30: qty 2

## 2021-11-30 MED ORDER — ACETAMINOPHEN 325 MG PO TABS: 650.0000 mg | ORAL_TABLET | ORAL | Status: DC | PRN

## 2021-11-30 MED ORDER — SODIUM CHLORIDE 0.9 % IV SOLN: 250.0000 mL | INTRAVENOUS | Status: DC | PRN

## 2021-11-30 MED ORDER — SODIUM CHLORIDE 0.9% FLUSH: 3.0000 mL | INTRAVENOUS | Status: DC | PRN

## 2021-11-30 MED ORDER — MORPHINE SULFATE (PF) 2 MG/ML IV SOLN
1.0000 mg | Freq: Once | INTRAVENOUS | Status: AC
  Administered 2021-11-30: 1 mg via INTRAVENOUS
  Filled 2021-11-30: qty 1

## 2021-11-30 MED ORDER — ORAL CARE MOUTH RINSE: 15.0000 mL | OROMUCOSAL | Status: DC | PRN

## 2021-11-30 MED ORDER — LIDOCAINE-PRILOCAINE 2.5-2.5 % EX CREA
TOPICAL_CREAM | Freq: Once | CUTANEOUS | Status: AC
  Administered 2021-11-30: 1 via TOPICAL
  Filled 2021-11-30: qty 5

## 2021-11-30 MED ORDER — SODIUM CHLORIDE 0.9% FLUSH
3.0000 mL | Freq: Two times a day (BID) | INTRAVENOUS | Status: DC
  Administered 2021-11-30 – 2021-12-01 (×2): 3 mL via INTRAVENOUS

## 2021-11-30 MED ORDER — CHLORHEXIDINE GLUCONATE CLOTH 2 % EX PADS
6.0000 | MEDICATED_PAD | Freq: Every day | CUTANEOUS | Status: DC
  Administered 2021-11-30: 6 via TOPICAL

## 2021-11-30 MED ORDER — SENNOSIDES-DOCUSATE SODIUM 8.6-50 MG PO TABS
2.0000 | ORAL_TABLET | Freq: Two times a day (BID) | ORAL | Status: DC
  Administered 2021-11-30 – 2021-12-01 (×2): 2 via ORAL
  Filled 2021-11-30 (×2): qty 2

## 2021-11-30 MED ORDER — CHLORHEXIDINE GLUCONATE CLOTH 2 % EX PADS
6.0000 | MEDICATED_PAD | Freq: Every day | CUTANEOUS | Status: DC
Start: 2021-11-30 — End: 2021-12-02
  Administered 2021-11-30 – 2021-12-01 (×2): 6 via TOPICAL

## 2021-11-30 MED ORDER — HYDRALAZINE HCL 10 MG PO TABS: 10.0000 mg | ORAL_TABLET | Freq: Three times a day (TID) | ORAL | Status: DC

## 2021-11-30 MED ORDER — ATORVASTATIN CALCIUM 10 MG PO TABS
20.0000 mg | ORAL_TABLET | Freq: Every day | ORAL | Status: DC
  Administered 2021-12-01: 20 mg via ORAL
  Filled 2021-11-30 (×2): qty 2

## 2021-11-30 MED ORDER — ASPIRIN 81 MG PO TBEC
81.0000 mg | DELAYED_RELEASE_TABLET | Freq: Every day | ORAL | Status: DC
  Administered 2021-12-01: 81 mg via ORAL
  Filled 2021-11-30: qty 1

## 2021-11-30 MED ORDER — ATROPINE SULFATE 1 MG/10ML IJ SOSY
PREFILLED_SYRINGE | INTRAMUSCULAR | Status: AC
  Administered 2021-11-30: 1 mg via INTRAVENOUS
  Filled 2021-11-30: qty 10

## 2021-11-30 MED ORDER — ONDANSETRON HCL 4 MG/2ML IJ SOLN: 4.0000 mg | Freq: Four times a day (QID) | INTRAMUSCULAR | Status: DC | PRN

## 2021-11-30 NOTE — H&P (Addendum)
Cardiology Admission History and Physical:   Patient ID: Shane Vazquez MRN: [id number]; DOB: 03/06/1930   Admission date: (Not on file)  PCP:  Patient, No Pcp Per   [place] Providers Cardiologist: New  Chief Complaint:  Dizziness  Patient Profile:   Shane Vazquez is a 88 y.o. male with h/o HTN, HLD, psoriasis, SMA stenosis, h/p prostate cancer s/p radiation who is being seen 11/30/2021 for the evaluation of dizziness, bradycardia and elevated troponin.  History of Present Illness:   Shane Vazquez lives in [place], but is visiting family in [place]. Daughter reports long history of dizziness and low heart rate. Says a pacemaker was discussed 3 years ago, but it was felt he was not a good candidate. He has low heart rate at baseline and is stays fairly active, walking daily. No h/o prior heart attack or cardiac stenting. No tobacco, alcohol or drug history.   He presented to the [facility] ER 7/19 for dizziness. He says this is a chronic thing, but felt it was much worse yesterday when he was walking. Says like he feels himself spinning. Also feels very weak. He denies chest pain, SOB, LLE, orthopnea or pnd. They initially went to an urgent care, but he was sent to the ER.   In the ER BP 173/53, pulse 50bpm, afebrile, O2 100%. Labs showed Na 135, KL 4.3, Scr 0.98, BUN 18, BG 124, WBC 3.0, Hgb 11.2. HS trop 100>95. BNP 51, TSH 4.3. CXR with possible infiltrates. EKG initially thought to have STE in 1 and aVL but it was not felt this met criteria. The patient was given ASA and admitted to the ICU. Urology saw for urinary retention and Coude catheter was placed. While in the ICU heart rate noted to drop into the 30s and he needed multiple doses of atropine with improvement. Telemetry also noted multiple 2-3 second pauses. Due to persistent dizziness and persistently low hear rates in the 30s, he was transferred to [facility] for EP consult and consideration of pacemaker.    He arrives stable, in d/w carelink team HR  maintained 40's, no requirement for atropine/intervention of any kind in route Today's visit is done with Video interpretor Kirtland Bouchard [phone] The patient reports that he has been dizzy, perhaps more of late Unfortunately I think we are losing some clarity in the translation/patient understanding Though he with BP 142/58 and HR 46 reports feeling dizzy. His dizziness is a constant feeling, not episodic  In review of his chart, this has been reported as ongoing for a year In further review I find a cardiology consult with c/o dizziness, leg weakness and findings of bradycardia then as well. With avg HR by a monitor 50,s min 30's, max 70's with pauses up to 1.7seconds  Past Medical History:  Diagnosis Date   Hypertension    Psoriasis    Shingles    Vertigo    Wears dentures    full upper and lower    Past Surgical History:  Procedure Laterality Date   ESOPHAGOGASTRODUODENOSCOPY     many yrs ago     Medications Prior to Admission: Prior to Admission medications   Medication Sig Start Date End Date Taking? Authorizing Provider  amLODipine (NORVASC) 5 MG tablet Take 1 tablet by mouth daily. 09/10/18   [provider]  atorvastatin (LIPITOR) 20 MG tablet TAKE 1 TABLET BY MOUTH EVERY DAY AT NIGHT 08/04/18   [provider]  cholecalciferol (VITAMIN D3) 25 MCG (1000 UNIT) tablet Take 2,000 Units by mouth  daily.    [provider]  gabapentin (NEURONTIN) 300 MG capsule Take 300 mg by mouth 2 (two) times daily. 12/25/16   [provider]  ibuprofen (ADVIL,MOTRIN) 200 MG tablet Take 200 mg by mouth every 6 (six) hours as needed.    [provider]  losartan (COZAAR) 100 MG tablet Take 100 mg by mouth daily.    [provider]  meclizine (ANTIVERT) 12.5 MG tablet Take 12.5 mg by mouth 3 (three) times daily as needed for dizziness.    [provider]  methotrexate (RHEUMATREX) 2.5 MG tablet Take 15 mg by mouth once a week.  Caution:Chemotherapy. Protect from light.    [provider]  Multiple Vitamin (MULTIVITAMIN ADULT) TABS Take 1 tablet by mouth daily.    [provider]  Olopatadine HCl (PAZEO) 0.7 % SOLN Place 1 drop into both eyes daily as needed. Patient not taking: Reported on 11/29/2021 01/17/18   [provider]     Allergies:   No Known Allergies  Social History:   Social History   Socioeconomic History   Marital status: Widowed    Spouse name: Not on file   Number of children: Not on file   Years of education: Not on file   Highest education level: Not on file  Occupational History   Not on file  Tobacco Use   Smoking status: Never   Smokeless tobacco: Never  Vaping Use   Vaping Use: Never used  Substance and Sexual Activity   Alcohol use: No   Drug use: No   Sexual activity: Not on file  Other Topics Concern   Not on file  Social History Narrative   Not on file   Social Determinants of Health   Financial Resource Strain: Not on file  Food Insecurity: Not on file  Transportation Needs: Not on file  Physical Activity: Not on file  Stress: Not on file  Social Connections: Not on file  Intimate Partner Violence: Not on file    Family History:   The patient's family history includes Heart disease in his father; Stomach cancer in his mother.    ROS:  Please see the history of present illness.  All other ROS reviewed and negative.     Physical Exam/Data:  There were no vitals filed for this visit. No intake or output data in the 24 hours ending 11/30/21 1430    11/30/2021    3:45 AM 11/29/2021    7:21 PM 01/30/2017    2:24 PM  Last 3 Weights  Weight (lbs) 142 lb 13.7 oz 143 lb 160 lb  Weight (kg) 64.8 kg 64.864 kg 72.576 kg     There is no height or weight on file to calculate BMI.  General:  Well nourished, well developed, in no acute distress HEENT: normal Neck: no JVD Vascular: No carotid bruits; Distal pulses 2+ bilaterally   Cardiac:   RRR, bradycardic; no murmur  Lungs:  CTA b/l, no wheezing, rhonchi or rales  Abd: soft, nontender, no hepatomegaly  Ext: no edema Musculoskeletal:  No deformities, age appropriate atrophy Skin: warm and dry  Neuro:  no focal abnormalities noted Psych:  Normal affect    EKG:  The ECGs personally reviewed SB 50bpm, 1st degree AVblock 266m SB 50bpm, unchanged Final is SB 45bpm, with a sinus pause, perhaps sinus block, 2.6 seconds  Relevant CV Studies:  Echo ordered  Laboratory Data:  High Sensitivity Troponin:   Recent Labs  Lab 11/29/21 1927 11/29/21  2042  TROPONINIHS 100* 95*      Chemistry Recent Labs  Lab 11/29/21 1927 11/30/21 0409  NA 135 135  K 4.3 4.1  CL 101 103  CO2 29 28  GLUCOSE 124* 99  BUN 18 16  CREATININE 0.98 0.84  CALCIUM 9.3 9.2  MG 2.2  --   GFRNONAA >60 >60  ANIONGAP 5 4*    Recent Labs  Lab 11/30/21 0409  PROT 6.9  ALBUMIN 4.0  AST 25  ALT 16  ALKPHOS 74  BILITOT 0.9   Lipids No results for input(s): "CHOL", "TRIG", "HDL", "LABVLDL", "LDLCALC", "CHOLHDL" in the last 168 hours. Hematology Recent Labs  Lab 11/29/21 1927 11/30/21 0010 11/30/21 0409  WBC 3.0*  --  3.0*  RBC 3.52* 3.88* 3.72*  HGB 11.2*  --  11.7*  HCT 32.8*  --  34.1*  MCV 93.2  --  91.7  MCH 31.8  --  31.5  MCHC 34.1  --  34.3  RDW 12.7  --  12.3  PLT 132*  --  137*   Thyroid  Recent Labs  Lab 11/29/21 2106  TSH 4.351  FREET4 0.79   BNP Recent Labs  Lab 11/29/21 1927  BNP 51.8    DDimer No results for input(s): "DDIMER" in the last 168 hours.   Radiology/Studies:  DG Chest 2 View  Result Date: 11/29/2021 CLINICAL DATA:  Dizziness EXAM: CHEST - 2 VIEW COMPARISON:  None Available. FINDINGS: Cardiac size is within normal limits. There are no signs of pulmonary edema. There is peribronchial thickening. Small patchy infiltrate is seen in medial left lower lung field seen only on the PA view. There is no pleural effusion or pneumothorax. IMPRESSION:  There are no signs of alveolar pulmonary edema. Small infiltrate in the medial left lower lung field may suggest crowding of normal bronchovascular structures or subsegmental atelectasis/pneumonia. Electronically Signed   By: Elmer Picker M.D.   On: 11/29/2021 19:47     Assessment and Plan:   Sinus node dysfunction Unclear is this is symptomatic bradycardia - reportedly long history of dizziness and bradycardia, - on no nodal blocking agents Reports of SB with HR into the 30s,  with pauses 2-3 seconds, unable to see [facility] tele Save strips with very transient slowing Treated with atropine at initial facility 2/2 reports of dizziness  Not ideal to pace him with an indwelling foley and recs for antibiotics. Not entirely convinced that he is acutely symptomatic from his bradycardia Perhaps transient slowing/pausing 2/2 vagal with urinary retention?   Elevated Troponin - patient denied anginal symptoms Minimally elevated and flat - [facility] further ischemic work[facility]up as an [facility]  HTN - BP elevated on admission - Hydralazine 116mTID  Acute urinary retention H/o Prostate Cancer - [facility] start flomax 0.77m72maily - plan for abx at discharge (cipro 250m58mily until seen by urology) Will ask that medicine team pick him back up tomorrow   For questions or updates, please contact [facility] Please consult [email or web address] for contact info under     Signed, ReneTommye Standard-C

## 2021-11-30 NOTE — Procedures (Signed)
   UROLOGY PROCEDURE NOTE  Indication: Acute urinary retention, bladder scan 700 and male with lower abdominal pain.  Reported history of prostate cancer recently treated with radiation in [place]  The patient was prepped and draped in standard sterile fashion.  On exam, the phallus was circumcised with a patent meatus, no blood at the meatus.  A 16 F Coloplast coud catheter passed easily into the bladder with return of clear yellow urine.  10 cc were placed in the balloon, the catheter was connected to dependent drainage, and the Foley was secured to the thigh.  Plan: Maintain Foley 1 week, will schedule outpatient voiding trial Okay to start Flomax 0.4 mg nightly from urology perspective  Nickolas Madrid, MD 11/30/2021

## 2021-11-30 NOTE — Discharge Summary (Addendum)
Physician Discharge Summary   Patient: Shane Vazquez MRN: [id number] DOB: April 26, 1930  Admit date:     11/29/2021  Discharge date: 11/30/21  Discharge Physician: Annita Brod   PCP: Patient, No Pcp Per   Recommendations at discharge:   Patient being transferred to [facility] for cardiac EP evaluation and possible pacemaker Patient will be discharged with Foley catheter in place.  Upon discharge from [facility], he will need a follow-up urology appointment as well as oral antibiotics and Flomax. Medication change: Home as needed Motrin discontinued  Discharge Diagnoses: Principal Problem:   Dizziness Active Problems:   Sinus bradycardia   Acute urinary retention   Essential hypertension   Pancytopenia (HCC)   Troponin level elevated   Gastritis   History of prostate cancer  Resolved Problems:   * No resolved hospital problems. *  Hospital Course: 88 year old male with past medical history of hypertension as well as prostate cancer status post radiation (in [place]) who presented to the emergency room with complaints of dizziness x1 year and patient found to be bradycardic with first-degree AV block.  Brought in for further evaluation and overnight developed acute urinary retention.  Straight catheter and coud catheter attempted but unsuccessful.  Urology consulted and Foley catheter placed.  Patient seen by cardiology and while awake, heart rate in the low 50s, but when he would fall asleep would go down to the 30s.  EP cardiology not available at [facility] today, so decision made to transfer patient to [facility].  Assessment and Plan: * Dizziness Vitals:   11/29/21 1921 11/29/21 2041 11/29/21 2100 11/29/21 2115  BP: (!) 173/53 (!) 179/57 (!) 151/56 (!) 164/55   11/29/21 2130 11/29/21 2200 11/29/21 2230 11/29/21 2300  BP: (!) 168/55 (!) 161/52 (!) 163/51 (!) 159/63  Orthostatic blood pressure. Attribute patient's dizziness to his underlying sinus bradycardia and AV  block.  Patient to transfer to [facility] for EP eval and pacemaker placement likely  Sinus bradycardia Symptomatic bradycardia causing dizziness requiring atropine. Pacer pads placed on patient in the event that he may require pacing overnight.  As above.  Appreciate cardiology help.  Normal thyroid function study  Acute urinary retention Likely secondary to bradycardia.  Coud catheter placed by urology after difficulties by nursing placement.  Given difficulty in actual placement, recommend that patient be discharged on p.o. antibiotics and Flomax and follow-up with urology as an outpatient in a week.  Essential hypertension Patient's home regimen of amlodipine and losartan held. Start patient on hydralazine as patient is bradycardic.  Pancytopenia (HCC) Mild pancytopenia.  Outpatient follow-up with hematology.  Troponin level elevated Suspect from demand ischemia. Cardiology consulted.  Gastritis History of gastritis gastric thickening noted on imaging study noted on chart review.  Patient on as needed Motrin at home which I will recommend he discontinue We will start patient on IV PPI therapy. Heart healthy diet n.p.o. after midnight.  History of prostate cancer Status post radiation.  Possibly contributing factor to urinary retention, although did not occur until after patient was hospitalized         Consultants: Cardiology, urology Procedures performed: Placement of coud Foley catheter Disposition:  Transfer to [place] recommendation:  Cardiac diet DISCHARGE MEDICATION: Allergies as of 11/30/2021   No Known Allergies      Medication List     STOP taking these medications    ibuprofen 200 MG tablet Commonly known as: ADVIL       TAKE these medications  amLODipine 5 MG tablet Commonly known as: NORVASC Take 1 tablet by mouth daily.   atorvastatin 20 MG tablet Commonly known as: LIPITOR TAKE 1 TABLET BY MOUTH EVERY DAY AT NIGHT    cholecalciferol 25 MCG (1000 UNIT) tablet Commonly known as: VITAMIN D3 Take 2,000 Units by mouth daily.   gabapentin 300 MG capsule Commonly known as: NEURONTIN Take 300 mg by mouth 2 (two) times daily.   losartan 100 MG tablet Commonly known as: COZAAR Take 100 mg by mouth daily.   meclizine 12.5 MG tablet Commonly known as: ANTIVERT Take 12.5 mg by mouth 3 (three) times daily as needed for dizziness.   methotrexate 2.5 MG tablet Commonly known as: RHEUMATREX Take 15 mg by mouth once a week. Caution:Chemotherapy. Protect from light.   Multivitamin Adult Tabs Take 1 tablet by mouth daily.   Pazeo 0.7 % Soln Generic drug: Olopatadine HCl Place 1 drop into both eyes daily as needed.        Discharge Exam: Danley Danker Weights   11/29/21 1921 11/30/21 0345  Weight: 64.9 kg 64.8 kg   General: Alert and oriented x3, fatigued Cardiovascular: Regular rate, bradycardic Lungs: Clear to auscultation bilaterally  Condition at discharge: fair  The results of significant diagnostics from this hospitalization (including imaging, microbiology, ancillary and laboratory) are listed below for reference.   Imaging Studies: DG Chest 2 View  Result Date: 11/29/2021 CLINICAL DATA:  Dizziness EXAM: CHEST - 2 VIEW COMPARISON:  None Available. FINDINGS: Cardiac size is within normal limits. There are no signs of pulmonary edema. There is peribronchial thickening. Small patchy infiltrate is seen in medial left lower lung field seen only on the PA view. There is no pleural effusion or pneumothorax. IMPRESSION: There are no signs of alveolar pulmonary edema. Small infiltrate in the medial left lower lung field may suggest crowding of normal bronchovascular structures or subsegmental atelectasis/pneumonia. Electronically Signed   By: Elmer Picker M.D.   On: 11/29/2021 19:47    Microbiology: Results for orders placed or performed during the hospital encounter of 11/29/21  MRSA Next Gen by  PCR, Nasal     Status: None   Collection Time: 11/30/21 12:17 AM   Specimen: Nasal Mucosa; Nasal Swab  Result Value Ref Range Status   MRSA by PCR Next Gen NOT DETECTED NOT DETECTED Final    Comment: (NOTE) The GeneXpert MRSA Assay (FDA approved for NASAL specimens only), is one component of a comprehensive MRSA colonization surveillance program. It is not intended to diagnose MRSA infection nor to guide or monitor treatment for MRSA infections. Test performance is not FDA approved in patients less than 30 years old. Performed at [facility], [address] [place],  [address]     Labs: CBC: Recent Labs  Lab 11/29/21 1927 11/30/21 0409  WBC 3.0* 3.0*  HGB 11.2* 11.7*  HCT 32.8* 34.1*  MCV 93.2 91.7  PLT 132* 419*   Basic Metabolic Panel: Recent Labs  Lab 11/29/21 1927 11/30/21 0409  NA 135 135  K 4.3 4.1  CL 101 103  CO2 29 28  GLUCOSE 124* 99  BUN 18 16  CREATININE 0.98 0.84  CALCIUM 9.3 9.2  MG 2.2  --    Liver Function Tests: Recent Labs  Lab 11/30/21 0409  AST 25  ALT 16  ALKPHOS 74  BILITOT 0.9  PROT 6.9  ALBUMIN 4.0   CBG: Recent Labs  Lab 11/30/21 0002  GLUCAP 104*    Discharge time spent: greater than  30 minutes.  Signed: Annita Brod, MD [facility] 11/30/2021

## 2021-11-30 NOTE — Progress Notes (Signed)
This pt had several episode of HR dropping down to 30's briefly with pauses lasting 2 secs. Rhythm would change from sinus brady to Mobitz type 2. Blood pressure stable at 309'M systolic.  NP aware.

## 2021-11-30 NOTE — Progress Notes (Addendum)
[employer] - Patient with history of bradycardia admitted with symptomatic bradycardia now having acute urination retention, and bladder spasms  Placement of catheter, straight and coude attempted by nursing but unsuccessful. Patient was able to void small amount on his own.   Action - Medicated with 1 mg morphine and 500 robaxin for bladder spasms Will hold further catheter placement attempts for now. Consult urology in am if needed.  Response - will need follow up

## 2021-11-30 NOTE — Consult Note (Signed)
Cardiology Consultation:   Patient ID: Shane Vazquez MRN: [id number]; DOB: Jun 11, 1929  Admit date: 11/29/2021 Date of Consult: 11/30/2021  PCP:  Patient, No Pcp Per   [place] Providers Cardiologist: New  Patient Profile:   Shane Vazquez is a 88 y.o. male with a hx of HTN, HLD, psoriasis, SMA stenosis who is being seen 11/30/2021 for the evaluation of elevated troponin and bradycardia at the request of Dr. Maryland Pink.  History of Present Illness:   Shane Vazquez normally lives in [place], but is visiting family here in [place]. Daughter reports a 3 year history of dizziness and low heart rate. Says 3 years ago they discussed a pacemaker, but felt he was not a good candidate. He has low heart rate at baseline.   No h/o prior heart attack or stenting. No tobacco, alcohol or drug history.   He presented to the ER 7/19 for dizziness. He has had it for a long time, but yesterday felt it was so bad he needed to come in. HE also feels generalized weakness. He denies chest pain, SOB, LLE, orthopnea, or pnd.  In the ER BP 173/53, pulse 50, afebrile, 100%O2. Labs showed Sodium 135, K 4.3, Scr 0.98, BUN 18. BG 124, WBC 3.0, Hgb 11.2. HS trop 100>95. Mag 2.2. BNP 51. TSH 4.351. CXR with possible infiltrate. EKG showed STE in 1 and aVL, this was discussed with cardiology who did not feel this met criteria, that there could be an extra P wave causing that morphology and on repeat EKG. The patient was given ASA. The patient was admitted for further work-up.    Past Medical History:  Diagnosis Date   Hypertension    Psoriasis    Shingles    Vertigo    Wears dentures    full upper and lower    Past Surgical History:  Procedure Laterality Date   ESOPHAGOGASTRODUODENOSCOPY     many yrs ago     Home Medications:  Prior to Admission medications   Medication Sig Start Date End Date Taking? Authorizing Provider  amLODipine (NORVASC) 5 MG tablet Take 1 tablet by mouth daily. 09/10/18  Yes  [provider]  atorvastatin (LIPITOR) 20 MG tablet TAKE 1 TABLET BY MOUTH EVERY DAY AT NIGHT 08/04/18  Yes [provider]  cholecalciferol (VITAMIN D3) 25 MCG (1000 UNIT) tablet Take 2,000 Units by mouth daily.   Yes [provider]  losartan (COZAAR) 100 MG tablet Take 100 mg by mouth daily.   Yes [provider]  methotrexate (RHEUMATREX) 2.5 MG tablet Take 15 mg by mouth once a week. Caution:Chemotherapy. Protect from light.   Yes [provider]  Multiple Vitamin (MULTIVITAMIN ADULT) TABS Take 1 tablet by mouth daily.   Yes [provider]  gabapentin (NEURONTIN) 300 MG capsule Take 300 mg by mouth 2 (two) times daily. 12/25/16   [provider]  ibuprofen (ADVIL,MOTRIN) 200 MG tablet Take 200 mg by mouth every 6 (six) hours as needed.    [provider]  meclizine (ANTIVERT) 12.5 MG tablet Take 12.5 mg by mouth 3 (three) times daily as needed for dizziness.    [provider]  Olopatadine HCl (PAZEO) 0.7 % SOLN Place 1 drop into both eyes daily as needed. Patient not taking: Reported on 11/29/2021 01/17/18   [provider]    Inpatient Medications: Scheduled Meds:  aspirin EC  81 mg Oral Daily   atorvastatin  20 mg Oral Daily   atropine  heparin  5,000 Units Subcutaneous Q8H   hydrALAZINE  10 mg Oral Q12H   pantoprazole (PROTONIX) IV  40 mg Intravenous Q12H   senna-docusate  2 tablet Oral BID   sodium chloride flush  3 mL Intravenous Q12H   Continuous Infusions:  sodium chloride Stopped (11/30/21 0400)   PRN Meds: acetaminophen **OR** acetaminophen, atropine, atropine, hydrALAZINE, mouth rinse  Allergies:   No Known Allergies  Social History:   Social History   Socioeconomic History   Marital status: Widowed    Spouse name: Not on file   Number of children: Not on file   Years of education: Not on file   Highest education level: Not on file  Occupational History   Not on file   Tobacco Use   Smoking status: Never   Smokeless tobacco: Never  Vaping Use   Vaping Use: Never used  Substance and Sexual Activity   Alcohol use: No   Drug use: No   Sexual activity: Not on file  Other Topics Concern   Not on file  Social History Narrative   Not on file   Social Determinants of Health   Financial Resource Strain: Not on file  Food Insecurity: Not on file  Transportation Needs: Not on file  Physical Activity: Not on file  Stress: Not on file  Social Connections: Not on file  Intimate Partner Violence: Not on file    Family History:    Family History  Problem Relation Age of Onset   Stomach cancer Mother    Heart disease Father      ROS:  Please see the history of present illness.   All other ROS reviewed and negative.     Physical Exam/Data:   Vitals:   11/30/21 0345 11/30/21 0400 11/30/21 0500 11/30/21 0600  BP:  (!) 168/70 (!) 146/90 (!) 152/82  Pulse:  (!) 52 63 (!) 52  Resp:  _0 Temp:      TempSrc:      SpO2:  100% 99% 100%  Weight: 64.8 kg     Height:        Intake/Output Summary (Last 24 hours) at 11/30/2021 0737 Last data filed at 11/30/2021 0600 Gross per 24 hour  Intake 134.1 ml  Output 600 ml  Net -465.9 ml      11/30/2021    3:45 AM 11/29/2021    7:21 PM 01/30/2017    2:24 PM  Last 3 Weights  Weight (lbs) 142 lb 13.7 oz 143 lb 160 lb  Weight (kg) 64.8 kg 64.864 kg 72.576 kg     Body mass index is 24.52 kg/m.  General:  Well nourished, well developed, in no acute distress HEENT: normal Neck: no JVD Vascular: No carotid bruits; Distal pulses 2+ bilaterally Cardiac:  normal S1, S2; bradycardic, RR; no murmur  Lungs:  clear to auscultation bilaterally, no wheezing, rhonchi or rales  Abd: soft, nontender, no hepatomegaly  Ext: no edema Musculoskeletal:  No deformities, BUE and BLE strength normal and equal Skin: warm and dry  Neuro:  CNs 2-12 intact, no focal abnormalities noted Psych:  Normal affect   EKG:   The EKG was personally reviewed and demonstrates:  SB 1st degree AV block, STE 1 aVL, otherwose nonspecific ST/T wave changes Telemetry:  Telemetry was personally reviewed and demonstrates:    SB HR 30-50s. Pauses 2-3 seconds, possible second degree AV block  Relevant CV Studies:  Echo ordered  Laboratory Data:  High Sensitivity Troponin:  Recent Labs  Lab 11/29/21 1927 11/29/21 2042  TROPONINIHS 100* 95*     Chemistry Recent Labs  Lab 11/29/21 1927 11/30/21 0409  NA 135 135  K 4.3 4.1  CL 101 103  CO2 29 28  GLUCOSE 124* 99  BUN 18 16  CREATININE 0.98 0.84  CALCIUM 9.3 9.2  MG 2.2  --   GFRNONAA >60 >60  ANIONGAP 5 4*    Recent Labs  Lab 11/30/21 0409  PROT 6.9  ALBUMIN 4.0  AST 25  ALT 16  ALKPHOS 74  BILITOT 0.9   Lipids No results for input(s): "CHOL", "TRIG", "HDL", "LABVLDL", "LDLCALC", "CHOLHDL" in the last 168 hours.  Hematology Recent Labs  Lab 11/29/21 1927 11/30/21 0010 11/30/21 0409  WBC 3.0*  --  3.0*  RBC 3.52* 3.88* 3.72*  HGB 11.2*  --  11.7*  HCT 32.8*  --  34.1*  MCV 93.2  --  91.7  MCH 31.8  --  31.5  MCHC 34.1  --  34.3  RDW 12.7  --  12.3  PLT 132*  --  137*   Thyroid  Recent Labs  Lab 11/29/21 2106  TSH 4.351  FREET4 0.79    BNP Recent Labs  Lab 11/29/21 1927  BNP 51.8    DDimer No results for input(s): "DDIMER" in the last 168 hours.   Radiology/Studies:  DG Chest 2 View  Result Date: 11/29/2021 CLINICAL DATA:  Dizziness EXAM: CHEST - 2 VIEW COMPARISON:  None Available. FINDINGS: Cardiac size is within normal limits. There are no signs of pulmonary edema. There is peribronchial thickening. Small patchy infiltrate is seen in medial left lower lung field seen only on the PA view. There is no pleural effusion or pneumothorax. IMPRESSION: There are no signs of alveolar pulmonary edema. Small infiltrate in the medial left lower lung field may suggest crowding of normal bronchovascular structures or subsegmental  atelectasis/pneumonia. Electronically Signed   By: Elmer Picker M.D.   On: 11/29/2021 19:47     Assessment and Plan:   Dizziness bradycardia - reportedly patient was considered for a pacemaker 3 years ago, however it was deferred for his ?age - he has baseline low heart rate, not on rate lowering medications - long history of bradycardia and dizziness - On presentation HR was 50bpm and BP was elevated - Labs unremarkable - overnight HR here into the 30s requiring Atropine - tele shows overnight HR 30-50s with pauses 2-3 seconds and possible Mobitz type 2 - also with urinary retention, which may have affected rates. - continue telemetry - echo ordered - will discuss with MD, may need to see EP to consider PPM. May also need to consider non-cardiac reasons for dizziness  Elevated troponin - patient denies anginal symptoms - IV heparin not started - s/p ASA 4m daily - can consider ischemic work-up with Cardiac CTA  HTN - Bps elevated on admission - Hydralazine 137mTID - still mildly elevated    For questions or updates, please contact [facility] consult [email or web address] for contact info under    Signed, Ruchama Kubicek H Ninfa MeekerPA-C  11/30/2021 7:37 AM

## 2021-11-30 NOTE — Assessment & Plan Note (Signed)
Likely secondary to bradycardia.  Coud catheter placed by urology after difficulties by nursing placement.  Given difficulty in actual placement, recommend that patient be discharged on p.o. antibiotics and Flomax and follow-up with urology as an outpatient in a week.

## 2021-11-30 NOTE — Consult Note (Signed)
Urology Consult   I have been asked to see the patient by Dr. Maryland Pink, for evaluation and management of urinary retention.  Chief Complaint: Urinary retention  HPI:  Shane Vazquez is a 88 y.o. year old male who was admitted overnight with weakness and bradycardia who developed inability to urinate with lower abdominal pain and bladder scan greater than 700 mL.  The history is obtained primarily from his daughter at bedside, as he is very uncomfortable at this time from the urinary retention.  He lives in [place] and is visiting family here for 1 month.  He reportedly has a history of prostate cancer treated recently with radiation in [place], but those records are not available to me.  No cross-sectional imaging from this hospitalization to review.  He reportedly had been urinating normally until this admission, and denies any dysuria, incontinence, or gross hematuria.  He was able to void a small amount overnight at 2 AM with 11-20 RBCs but otherwise benign urine.  Nursing has been unable to place a Foley and urology was consulted.   PMH: Past Medical History:  Diagnosis Date   Hypertension    Psoriasis    Shingles    Vertigo    Wears dentures    full upper and lower  -Prostate cancer  Surgical History: Past Surgical History:  Procedure Laterality Date   ESOPHAGOGASTRODUODENOSCOPY     many yrs ago      Allergies: No Known Allergies  Family History: Family History  Problem Relation Age of Onset   Stomach cancer Mother    Heart disease Father     Social History:  reports that he has never smoked. He has never used smokeless tobacco. He reports that he does not drink alcohol and does not use drugs.  ROS: Unable to be obtained secondary to patient condition with severe pain from acute urinary retention  Physical Exam: BP (!) 152/82 (BP Location: Right Arm)   Pulse (!) 52   Temp 98.2 F (36.8 C) (Oral)   Resp 15   Ht 5' 4"  (1.626 m)   Wt 64.8 kg   SpO2 100%   BMI  24.52 kg/m    Constitutional: Very uncomfortable, lying in bed Cardiovascular: No clubbing, cyanosis, or edema. Respiratory: Normal respiratory effort, no increased work of breathing. GI: Abdomen is soft, nontender, nondistended, no abdominal masses GU: Lower abdominal pain, palpable bladder Lymph: No cervical or inguinal lymphadenopathy. Skin: No rashes, bruises or suspicious lesions. Neurologic: Grossly intact, no focal deficits, moving all 4 extremities. Psychiatric: Normal mood and affect.   Laboratory Data: Reviewed in epic Urinalysis with 11-20 RBCs Creatinine normal 0.84, EGFR greater than 60  See separate procedure note for Foley catheter placement  Assessment & Plan:   88 year old man admitted with weakness and bradycardia who developed urinary retention this morning with lower abdominal pain, palpable bladder, and bladder scan greater than 700 mL.  He was able to void a small amount of urine yesterday with 11-20 RBCs but otherwise benign.  He reportedly has a history of prostate cancer that was recently treated with radiation in [place], those records are unavailable to me.  A 16 French Coloplast coud catheter passed easily into the bladder with return of clear yellow urine.  Recommendations: -Maintain Foley during hospitalization -We will arrange outpatient Foley removal and voiding trial in 1 week -Recommend starting Flomax 0.4 mg nightly if weakness and blood pressure stabilize over the next 24 hours  Billey Co, MD  [facility] [address], [place], [place] [address] [phone]

## 2021-11-30 NOTE — Assessment & Plan Note (Signed)
Status post radiation.  Possibly contributing factor to urinary retention, although did not occur until after patient was hospitalized

## 2021-11-30 NOTE — Hospital Course (Addendum)
88 year old male with past medical history of hypertension as well as prostate cancer status post radiation (in [place]) who presented to the emergency room with complaints of dizziness x1 year and patient found to be bradycardic with first-degree AV block.  Brought in for further evaluation and overnight developed acute urinary retention.  Straight catheter and coud catheter attempted but unsuccessful.  Urology consulted and Foley catheter placed.  Patient seen by cardiology and while awake, heart rate in the low 50s, but when he would fall asleep would go down to the 30s.  EP cardiology not available at [facility] today, so decision made to transfer patient to [facility].

## 2021-11-30 NOTE — Progress Notes (Signed)
Atropine SO4 given for heart rate sustaining to 30's with episodes of pauses lasting 2 secs. Pt is symptomatic and says he feel dizzy. NP at bedside. Heart rate to 60's after atropine dose.

## 2021-12-01 ENCOUNTER — Other Ambulatory Visit (HOSPITAL_COMMUNITY): Payer: Self-pay

## 2021-12-01 ENCOUNTER — Inpatient Hospital Stay (HOSPITAL_BASED_OUTPATIENT_CLINIC_OR_DEPARTMENT_OTHER): Payer: Medicare HMO

## 2021-12-01 ENCOUNTER — Telehealth (HOSPITAL_COMMUNITY): Payer: Self-pay | Admitting: Pharmacy Technician

## 2021-12-01 DIAGNOSIS — R778 Other specified abnormalities of plasma proteins: Secondary | ICD-10-CM

## 2021-12-01 DIAGNOSIS — R001 Bradycardia, unspecified: Secondary | ICD-10-CM

## 2021-12-01 DIAGNOSIS — R42 Dizziness and giddiness: Secondary | ICD-10-CM | POA: Diagnosis not present

## 2021-12-01 LAB — BASIC METABOLIC PANEL
Anion gap: 5 (ref 5–15)
BUN: 10 mg/dL (ref 8–23)
CO2: 25 mmol/L (ref 22–32)
Calcium: 9 mg/dL (ref 8.9–10.3)
Chloride: 104 mmol/L (ref 98–111)
Creatinine, Ser: 0.82 mg/dL (ref 0.61–1.24)
GFR, Estimated: >60 mL/min (ref >60)
Glucose, Bld: 108 mg/dL — ABNORMAL HIGH (ref 70–99)
Potassium: 3.7 mmol/L (ref 3.5–5.1)
Sodium: 134 mmol/L — ABNORMAL LOW (ref 135–145)

## 2021-12-01 LAB — ECHOCARDIOGRAM COMPLETE
AR max vel: 2.39 cm2
AV Peak grad: 12.2 mmHg
Ao pk vel: 1.75 m/s
Area-P 1/2: 1.83 cm2
S' Lateral: 2.7 cm
Weight: 2204.6 oz

## 2021-12-01 LAB — CBC
HCT: 30.9 % — ABNORMAL LOW (ref 39.0–52.0)
Hemoglobin: 10.7 g/dL — ABNORMAL LOW (ref 13.0–17.0)
MCH: 31.9 pg (ref 26.0–34.0)
MCHC: 34.6 g/dL (ref 30.0–36.0)
MCV: 92.2 fL (ref 80.0–100.0)
Platelets: 127 10*3/uL — ABNORMAL LOW (ref 150–400)
RBC: 3.35 MIL/uL — ABNORMAL LOW (ref 4.22–5.81)
RDW: 12.3 % (ref 11.5–15.5)
WBC: 3.7 10*3/uL — ABNORMAL LOW (ref 4.0–10.5)
nRBC: 0 % (ref 0.0–0.2)

## 2021-12-01 MED ORDER — CEFDINIR 300 MG PO CAPS
300.0000 mg | ORAL_CAPSULE | Freq: Two times a day (BID) | ORAL | Status: DC
  Administered 2021-12-01: 300 mg via ORAL
  Filled 2021-12-01 (×2): qty 1

## 2021-12-01 MED ORDER — GABAPENTIN 300 MG PO CAPS: 300.0000 mg | ORAL_CAPSULE | Freq: Two times a day (BID) | ORAL | 0 refills | Status: DC

## 2021-12-01 MED ORDER — CEFPODOXIME PROXETIL 200 MG PO TABS: 200.0000 mg | ORAL_TABLET | Freq: Every day | ORAL | 0 refills | Status: AC

## 2021-12-01 MED ORDER — ATORVASTATIN CALCIUM 20 MG PO TABS: 20.0000 mg | ORAL_TABLET | Freq: Every day | ORAL | 3 refills | Status: DC

## 2021-12-01 NOTE — Progress Notes (Signed)
  Transition of Care [facility]) Screening Note   Patient Details  Name: Shane Vazquez Date of Birth: 13-Nov-1929   Transition of Care [facility]) CM/SW Contact:    Milas Gain, [place] Phone Number: 12/01/2021, 4:32 PM    Transition of Care Department [facility]) has reviewed patient and no TOC needs have been identified at this time. We will continue to monitor patient advancement through interdisciplinary progression rounds. If new patient transition needs arise, please place a TOC consult.

## 2021-12-01 NOTE — Care Management (Signed)
Mr Kumari not available at this time, Notice given via Interpretation phone Cabin crew) to daughter Bob Daversa. Asked the RN to print out and place in room for review.

## 2021-12-01 NOTE — TOC Benefit Eligibility Note (Signed)
Patient Teacher, English as a foreign language completed.    The patient is currently admitted and upon discharge could be taking cefpodoxime (Vantin) 200 mg.  The current 30 day co-pay is, $4.15.   The patient is currently admitted and upon discharge could be taking cefdiner  300 mg.  The current 30 day co-pay is, $4.15.  The patient is insured through [place] Patient Advocate Specialist [place] Patient Advocate Team Direct Number: [phone]  Fax: [phone]

## 2021-12-01 NOTE — Telephone Encounter (Signed)
Pharmacy Patient Advocate Encounter  Insurance verification completed.    The patient is insured through [employer] Part D   The patient is currently admitted and ran test claims for the following: Cefpodoxime, cefdiner.  Copays and coinsurance results were relayed to Inpatient clinical team.

## 2021-12-01 NOTE — Care Management Obs Status (Signed)
[place] NOTIFICATION   Patient Details  Name: Shane Vazquez MRN: [id number] Date of Birth: [birth year]   Medicare Observation Status Notification Given:  Yes    Verdell Carmine, RN 12/01/2021, 4:51 PM

## 2021-12-01 NOTE — Progress Notes (Signed)
Progress Note  Patient Name: Shane Vazquez Date of Encounter: 12/01/2021  [facility] Cardiologist: new  Subjective   Feels OK, no CP, no SOB  Inpatient Medications    Scheduled Meds:  aspirin EC  81 mg Oral Daily   atorvastatin  20 mg Oral Daily   Chlorhexidine Gluconate Cloth  6 each Topical Daily   hydrALAZINE  10 mg Oral Q8H   mirabegron ER  25 mg Oral Daily   pantoprazole (PROTONIX) IV  40 mg Intravenous Q12H   senna-docusate  2 tablet Oral BID   sodium chloride flush  3 mL Intravenous Q12H   tamsulosin  0.4 mg Oral Daily   Continuous Infusions:  sodium chloride     PRN Meds: sodium chloride, acetaminophen, nitroGLYCERIN, ondansetron (ZOFRAN) IV, sodium chloride flush   Vital Signs    Vitals:   12/01/21 0800 12/01/21 0900 12/01/21 1000 12/01/21 1100  BP: (!) 133/52 (!) 130/52 (!) 142/48 (!) 130/52  Pulse: (!) 49 (!) 48 (!) 48 (!) 47  Resp: 16 13 (!) 21 19  Temp:      TempSrc:      SpO2: 98% 100% 100% 99%  Weight:        Intake/Output Summary (Last 24 hours) at 12/01/2021 1145 Last data filed at 12/01/2021 1130 Gross per 24 hour  Intake 220 ml  Output 1150 ml  Net -930 ml      12/01/2021    6:00 AM 11/30/2021    3:45 AM 11/29/2021    7:21 PM  Last 3 Weights  Weight (lbs) 137 lb 12.6 oz 142 lb 13.7 oz 143 lb  Weight (kg) 62.5 kg 64.8 kg 64.864 kg      Telemetry    SB 40's-50's, infrequent brief sinus pauses, all <3seconds - Personally Reviewed  ECG    No new EKGs - Personally Reviewed  Physical Exam   GEN: No acute distress.   Neck: No JVD Cardiac: RRR, no murmurs, rubs, or gallops.  Respiratory: CTA b/l. GI: Soft, nontender, non-distended  MS: No edema; No deformity. Neuro:  Nonfocal  Psych: Normal affect   Labs    High Sensitivity Troponin:   Recent Labs  Lab 11/29/21 1927 11/29/21 2042  TROPONINIHS 100* 95*     Chemistry Recent Labs  Lab 11/29/21 1927 11/30/21 0409 12/01/21 0104  NA 135 135 134*  K 4.3 4.1 3.7  CL  101 103 104  CO2 '29 28 25  '$ GLUCOSE 124* 99 108*  BUN '18 16 10  '$ CREATININE 0.98 0.84 0.82  CALCIUM 9.3 9.2 9.0  MG 2.2  --   --   PROT  --  6.9  --   ALBUMIN  --  4.0  --   AST  --  25  --   ALT  --  16  --   ALKPHOS  --  74  --   BILITOT  --  0.9  --   GFRNONAA >60 >60 >60  ANIONGAP 5 4* 5    Lipids No results for input(s): "CHOL", "TRIG", "HDL", "LABVLDL", "LDLCALC", "CHOLHDL" in the last 168 hours.  Hematology Recent Labs  Lab 11/29/21 1927 11/30/21 0010 11/30/21 0409 12/01/21 0104  WBC 3.0*  --  3.0* 3.7*  RBC 3.52* 3.88* 3.72* 3.35*  HGB 11.2*  --  11.7* 10.7*  HCT 32.8*  --  34.1* 30.9*  MCV 93.2  --  91.7 92.2  MCH 31.8  --  31.5 31.9  MCHC 34.1  --  34.3 34.6  RDW 12.7  --  12.3 12.3  PLT 132*  --  137* 127*   Thyroid  Recent Labs  Lab 11/29/21 2106  TSH 4.351  FREET4 0.79    BNP Recent Labs  Lab 11/29/21 1927  BNP 51.8    DDimer No results for input(s): "DDIMER" in the last 168 hours.   Radiology     Cardiac Studies   12/01/2021: TTE 1. Left ventricular ejection fraction, by estimation, is 60 to 65%. The  left ventricle has normal function. The left ventricle has no regional  wall motion abnormalities. Left ventricular diastolic parameters are  consistent with Grade I diastolic  dysfunction (impaired relaxation).   2. Right ventricular systolic function is normal. The right ventricular  size is normal.   3. The mitral valve is degenerative. No evidence of mitral valve  regurgitation. No evidence of mitral stenosis. Moderate mitral annular  calcification.   4. The aortic valve is tricuspid. There is moderate calcification of the  aortic valve. There is moderate thickening of the aortic valve. Aortic  valve regurgitation is not visualized. Aortic valve  sclerosis/calcification is present, without any evidence  of aortic stenosis.   5. The inferior vena cava is normal in size with greater than 50%  respiratory variability, suggesting right  atrial pressure of 3 mmHg.   Patient Profile     88 y.o. male w/PMHx of HTN, HLD, psoriasis, SMA stenosis, h/p prostate cancer s/p radiation admitted to [facility] with dizziness, and observed to be bradycardic, also with urinary retention and foley placed by urology  UA however without signs of infection   Assessment & Plan    Sinus bradycardia Unclear symptoms are 2/2 his bradycardia  His daughte is here that knows his HPI and PMHx well He lived with her here back in 2019 He has c.o dizziness for many years, this is not by her report constant day in/day out but only when he is up and around Not only at the time of standing either. She reports that he has seen ENT/been checked for vertigo and not that. He walks a couple miles regularly and continues to tod othis, his daughter reports a bit slower over the years.  She states he was urinating fine at home No syncope He describes to her he feels unsteady intermittently like he is "a little drunk" He has mentioned leg heaviness for many years  Discussed perhaps this is symptomatic bradycardia though not clear cut.  That a pacer may help, though now with an indwelling foly, perhaps given no syncope and no overwhelming symptoms we may hold off and do after the foley is out and discussed rational. They understand   HTN Looks ok here  Urinary retention Foley placed at [facility] Urology note reports they were making follow up for him in one week for voiding trial Medicine MD at [facility] also recommended antibiotic of either Vantin 200 daily or Cipro 250 daily until seen by urology  Will come back once Dr. Quentin Ore is out of case and in-house interpretor will be used Yesterday video translation was difficult  For questions or updates, please contact [place] Please consult [email or web address] for contact info under        Signed, Baldwin [place], PA-C  12/01/2021, 11:45 AM

## 2021-12-01 NOTE — Discharge Summary (Signed)
ELECTROPHYSIOLOGY PROCEDURE DISCHARGE SUMMARY    Patient ID: Shane Vazquez,  MRN: [id number], DOB/AGE: 09-10-1929 88 y.o.  Admit date: 11/30/2021 Discharge date: 12/01/2021  Primary Care Physician: Patient, No Pcp Per  Primary Cardiologist: none actively Electrophysiologist: new, Dr. Quentin Ore  Primary Discharge Diagnosis:  Dizziness Bradycardia Urinary retention  Secondary Discharge Diagnosis:  HTN HLD    No Known Allergies   Procedures This Admission:  none  Brief HPI: Shane Vazquez is a 88 y.o. male went to the ER at [facility] for an up tick in dizziness that has been chronic for years.  He was found to be bradycardic, and admitted .   Hospital Course:  The patient was admitted and with HRs that were dipping  into the 30's given atropine intermittently No CP, no SOB Labs largely unremarkable.  He developed urinary retention and required urology for  underwent implantation of a foley catheter.   Was decided tio transfer him to [facility] for EP evaluation and consideration of PPM.  While here his HR remained stable largely 40's-50's, brief pauses at times of <3 seconds.  BP remained stable SBP 130's-150's mostly Will hold off on BP meds for now, he has been without them of late at home as well.  TTE with preserved LVEF  With translation services was felt we were losing some details in his symptoms of late and historically.  His daughter able to clarify well. He described to her his dizziness a sense of feeling off balance like he was a "little drunk" without drinking He has had years dizziness for many years, this is not by her report constant day in/day out but only when he is up and around.  Not only at the time of standing either. She reported that he has seen ENT/been checked for vertigo and was not that. He walks a couple miles regularly and continues to to do this, his daughter reports a bit slower over the years, but not a marked/sudden change He had never fainted  He  has been known to be bradycardic for years, and perhaps would benefit from [facility]  Given the foley now and IM at [facility] recommendations for antibiotics until seen by urology, without clear change in his chronic symptoms, Dr. Quentin Ore felt we should hold off and see him in the office after urology has seen him and foley is out to minimize infection risk of PPM implant   The patient feels well, at his baseline, denies any CP/SOB, he was examined by Dr. Quentin Ore and considered stable for discharge to home.   Urology f/u is in place  Vantin '200mg'$  daily at IM recommendations will be prescribed to get to his urology f/u next week  EP follow up is in place  Return precautions were discussed  Patient is staying her in [place] with no firm/planned return to [place] timing, his daughter states he can stay for as long as needed.    Physical Exam: Vitals:   12/01/21 1200 12/01/21 1300 12/01/21 1400 12/01/21 1500  BP: (!) 142/55 (!) 123/48 (!) 136/51 (!) 115/53  Pulse: (!) 44 (!) 48 (!) 48 (!) 52  Resp: '17 16 17 17  '$ Temp:      TempSrc:      SpO2: 97% 100% 98% 97%  Weight:       Unchanged exam from earlier today  Labs:   Lab Results  Component Value Date   WBC 3.7 (L) 12/01/2021   HGB 10.7 (L) 12/01/2021   HCT 30.9 (L)  12/01/2021   MCV 92.2 12/01/2021   PLT 127 (L) 12/01/2021    Recent Labs  Lab 11/30/21 0409 12/01/21 0104  NA 135 134*  K 4.1 3.7  CL 103 104  CO2 28 25  BUN 16 10  CREATININE 0.84 0.82  CALCIUM 9.2 9.0  PROT 6.9  --   BILITOT 0.9  --   ALKPHOS 74  --   ALT 16  --   AST 25  --   GLUCOSE 99 108*    Discharge Medications:  Allergies as of 12/01/2021   No Known Allergies      Medication List     STOP taking these medications    amLODipine 5 MG tablet Commonly known as: NORVASC   losartan 50 MG tablet Commonly known as: COZAAR       TAKE these medications    atorvastatin 20 MG tablet Commonly known as: LIPITOR Take 20 mg by mouth at bedtime.    cefpodoxime 200 MG tablet Commonly known as: VANTIN Take 1 tablet (200 mg total) by mouth daily for 5 days. Start taking on: December 02, 1608   folic acid 1 MG tablet Commonly known as: FOLVITE Take 1 mg by mouth daily.   gabapentin 300 MG capsule Commonly known as: NEURONTIN Take 300 mg by mouth 2 (two) times daily.   methotrexate 2.5 MG tablet Commonly known as: RHEUMATREX Take 15 mg by mouth every Thursday. Caution:Chemotherapy. Protect from light.   Multivitamin Adult Tabs Take 1 tablet by mouth daily with breakfast.   Pazeo 0.7 % Soln Generic drug: Olopatadine HCl Place 1 drop into both eyes daily as needed (for allergies).   Vitamin D3 50 MCG (2000 UT) Tabs Take 2,000 Units by mouth daily.        Disposition: Home (with his daughter) Discharge Instructions     Diet - low sodium heart healthy   Complete by: As directed        Follow-up Information     Billey Co, MD Follow up.   Specialty: Urology Why: 12/07/21 @ 9:00AM for foley removeal, you will also be seen later in the day again at 2:30PM Contact information: [place] [address] [phone]         Vickie Epley, MD Follow up.   Specialties: Cardiology, Radiology Why: 12/21/19 at 11:00AM, revisit pacemaker implant and timing Contact information: [place] [address] [phone]                 Duration of Discharge Encounter: Greater than 30 minutes including physician time.  Venetia Night, PA-C 12/01/2021 4:50 PM

## 2021-12-01 NOTE — Care Management CC44 (Signed)
Condition Code 44 Documentation Completed  Patient Details  Name: Shane Vazquez MRN: [id number] Date of Birth: 1929-11-27   Condition Code 44 given:  Yes Patient signature on Condition Code 44 notice:  Yes Documentation of 2 MD's agreement:  Yes Code 44 added to claim:  Yes Erbal consent by Daughter Druscilla Brownie, RN 12/01/2021, 4:50 PM

## 2021-12-07 ENCOUNTER — Ambulatory Visit: Payer: Medicare HMO | Admitting: Urology

## 2021-12-07 ENCOUNTER — Ambulatory Visit (INDEPENDENT_AMBULATORY_CARE_PROVIDER_SITE_OTHER): Payer: Medicare HMO | Admitting: Urology

## 2021-12-07 ENCOUNTER — Encounter: Payer: Self-pay | Admitting: Urology

## 2021-12-07 VITALS — BP 162/63 | HR 50 | Ht 65.0 in | Wt 142.0 lb

## 2021-12-07 DIAGNOSIS — C61 Malignant neoplasm of prostate: Secondary | ICD-10-CM

## 2021-12-07 DIAGNOSIS — R339 Retention of urine, unspecified: Secondary | ICD-10-CM

## 2021-12-07 LAB — BLADDER SCAN AMB NON-IMAGING

## 2021-12-07 NOTE — Progress Notes (Signed)
Patient ID: Shane Vazquez, male   DOB: [birth year], 88 y.o.   MRN: [id number] Catheter Removal  Patient is present today for a catheter removal.  83m of water was drained from the balloon. A 18FR foley cath was removed from the bladder no complications were noted . Patient tolerated well.  Performed by: DEdwin Dada CMA  Follow up/ Additional notes: 12/07/2021 '@2'$ :30

## 2021-12-07 NOTE — Progress Notes (Signed)
  He was seen this morning by Dr Diamantina Providence for catheter removal in light of urinary retention during admission on 11/30/2021. He returned today and he was able to drink 32 oz of water  he urinated twice with strong stream and good emptying. He is able to urinate now and would like to go home. His daughter was here and I advised them to return if he is unable to urinate.   Results for orders placed or performed in visit on 12/07/21  Bladder Scan (Post Void Residual) in office  Result Value Ref Range   Scan Result 147m     Return in 1 month for recheck

## 2021-12-07 NOTE — Progress Notes (Incomplete)
12/07/21 1:00 PM   Shane Vazquez December 04, 1929 [id number]  Referring provider:  No referring provider defined for this encounter. No chief complaint on file.   Urological history  Urinary retention  - Saw Dr Diamantina Providence on 11/30/2021 for acute urinary retention when he was admitted in the ICU at [facility] for weakness and bradycardia. - >724m in bladder on 11/30/2021; a 16 FR Coloplast coud catheter placed with urine return.   2. History of prostate cancer  - [place] patient  [place]   HPI: Shane Hulbertis a 88y.o.male who presents today for PVR post catheter removal.   He had his catheter removed today by Dr SDiamantina Providence       PMH: Past Medical History:  Diagnosis Date   Hypertension    Psoriasis    Shingles    Vertigo    Wears dentures    full upper and lower    Surgical History: Past Surgical History:  Procedure Laterality Date   ESOPHAGOGASTRODUODENOSCOPY     many yrs ago    Home Medications:  Allergies as of 12/07/2021   No Known Allergies      Medication List        Accurate as of December 07, 2021  1:00 PM. If you have any questions, ask your nurse or doctor.          atorvastatin 20 MG tablet Commonly known as: LIPITOR Take 1 tablet (20 mg total) by mouth at bedtime.   cefpodoxime 200 MG tablet Commonly known as: VANTIN Take 1 tablet (200 mg total) by mouth daily for 5 days.   folic acid 1 MG tablet Commonly known as: FOLVITE Take 1 mg by mouth daily.   gabapentin 300 MG capsule Commonly known as: NEURONTIN Take 1 capsule (300 mg total) by mouth 2 (two) times daily.   methotrexate 2.5 MG tablet Commonly known as: RHEUMATREX Take 15 mg by mouth every Thursday. Caution:Chemotherapy. Protect from light.   Multivitamin Adult Tabs Take 1 tablet by mouth daily with breakfast.   Pazeo 0.7 % Soln Generic drug: Olopatadine HCl Place 1 drop into both eyes daily as needed (for allergies).   Vitamin D3 50 MCG (2000 UT) Tabs Take  2,000 Units by mouth daily.        Allergies:  No Known Allergies  Family History: Family History  Problem Relation Age of Onset   Stomach cancer Mother    Heart disease Father     Social History:  reports that he has never smoked. He has never been exposed to tobacco smoke. He has never used smokeless tobacco. He reports that he does not drink alcohol and does not use drugs.   Physical Exam: There were no vitals taken for this visit.  Constitutional:  Alert and oriented, No acute distress. HEENT: [place] AT, moist mucus membranes.  Trachea midline, no masses. Cardiovascular: No clubbing, cyanosis, or edema. Respiratory: Normal respiratory effort, no increased work of breathing. GI: Abdomen is soft, nontender, nondistended, no abdominal masses GU: No CVA tenderness Lymph: No cervical or inguinal lymphadenopathy. Skin: No rashes, bruises or suspicious lesions. Neurologic: Grossly intact, no focal deficits, moving all 4 extremities. Psychiatric: Normal mood and affect.  Laboratory Data:  Lab Results  Component Value Date   CREATININE 0.82 12/01/2021     No results found for: "HGBA1C"  Urinalysis   Pertinent Imaging:   Assessment & Plan:     No follow-ups on file.  [place]  [place],  [address] [phone]  I,Kailey Littlejohn,acting as a scribe for [facility], PA-C.,have documented all relevant documentation on the behalf of [place], PA-C,as directed by  [facility], PA-C while in the presence of SHANNON MCGOWAN, PA-C.

## 2021-12-07 NOTE — Progress Notes (Signed)
   12/07/2021 9:40 AM   Shane Vazquez 01/13/1930 [id number]  Reason for visit: Follow up urinary retention, history of prostate cancer  HPI: 88 year old Spanish-speaking male here with his daughter today, she helps provide most of the history.  I saw him on 11/30/2021 for acute urinary retention when he was admitted in the ICU at [facility] for weakness and bradycardia.  He had lower abdominal pain and bladder scan greater than 757m, and nurses were unable to place Foley.  I placed a 135FScientist, physiologicalcoud catheter easily with return of clear yellow urine and relief of his lower abdominal pain.  For unclear reasons he has been on antibiotics since that time.  [facility] but they wanted him to have his Foley catheter removed prior to considering any procedures.  He reportedly has [place] and [place] are not available to me.  He is here temporarily for the month visiting family [place]  We discussed the risks and benefits of Flomax with his baseline weakness and dizziness, and using shared decision making we opted to defer at this time.  Foley catheter was removed, and he was able to void multiple times this afternoon with a strong stream.  Bladder scan this afternoon was 1840m  Return precautions discussed extensively, can follow-up in 1 month for PVR and symptom check if remains in the area, otherwise encourage close [place]  BrBilley CoMDDulcerological Associates [address]one[place] [phone]

## 2021-12-20 ENCOUNTER — Ambulatory Visit: Payer: Medicare HMO | Admitting: Cardiology

## 2021-12-20 ENCOUNTER — Other Ambulatory Visit
Admission: RE | Admit: 2021-12-20 | Discharge: 2021-12-20 | Disposition: A | Discharge status: Home / Self Care | Payer: Medicare HMO | Attending: Cardiology | Admitting: Cardiology

## 2021-12-20 ENCOUNTER — Encounter: Payer: Self-pay | Admitting: Cardiology

## 2021-12-20 ENCOUNTER — Encounter: Payer: Self-pay | Admitting: *Deleted

## 2021-12-20 VITALS — BP 146/58 | HR 46 | Ht 65.0 in | Wt 147.8 lb

## 2021-12-20 DIAGNOSIS — Z01818 Encounter for other preprocedural examination: Secondary | ICD-10-CM | POA: Diagnosis present

## 2021-12-20 DIAGNOSIS — I700 Atherosclerosis of aorta: Secondary | ICD-10-CM

## 2021-12-20 DIAGNOSIS — R338 Other retention of urine: Secondary | ICD-10-CM | POA: Diagnosis present

## 2021-12-20 DIAGNOSIS — R001 Bradycardia, unspecified: Secondary | ICD-10-CM

## 2021-12-20 LAB — CBC WITH DIFFERENTIAL/PLATELET
Abs Immature Granulocytes: 0.01 10*3/uL (ref 0.00–0.07)
Basophils Absolute: 0 10*3/uL (ref 0.0–0.1)
Basophils Relative: 1 %
Eosinophils Absolute: 0.1 10*3/uL (ref 0.0–0.5)
Eosinophils Relative: 3 %
HCT: 33.6 % — ABNORMAL LOW (ref 39.0–52.0)
Hemoglobin: 11.3 g/dL — ABNORMAL LOW (ref 13.0–17.0)
Immature Granulocytes: 0 %
Lymphocytes Relative: 39 %
Lymphs Abs: 1.4 10*3/uL (ref 0.7–4.0)
MCH: 31.6 pg (ref 26.0–34.0)
MCHC: 33.6 g/dL (ref 30.0–36.0)
MCV: 93.9 fL (ref 80.0–100.0)
Monocytes Absolute: 0.4 10*3/uL (ref 0.1–1.0)
Monocytes Relative: 11 %
Neutro Abs: 1.7 10*3/uL (ref 1.7–7.7)
Neutrophils Relative %: 46 %
Platelets: 154 10*3/uL (ref 150–400)
RBC: 3.58 MIL/uL — ABNORMAL LOW (ref 4.22–5.81)
RDW: 12.8 % (ref 11.5–15.5)
WBC: 3.6 10*3/uL — ABNORMAL LOW (ref 4.0–10.5)
nRBC: 0 % (ref 0.0–0.2)

## 2021-12-20 MED ORDER — GABAPENTIN 300 MG PO CAPS: 300.0000 mg | ORAL_CAPSULE | Freq: Two times a day (BID) | ORAL | 3 refills | Status: DC

## 2021-12-20 NOTE — Progress Notes (Signed)
Electrophysiology Office Follow up Visit Note:    Date:  12/20/2021   ID:  Shane Vazquez, DOB 09-28-1929, MRN [id number]  PCP:  Patient, No Pcp Per  [place] Cardiologist:  None  [facility] Electrophysiologist:  Vickie Epley, MD    Interval History:    Shane Vazquez is a 88 y.o. male who presents for a follow up visit.  He was hospitalized last month for bladder outflow tract obstruction requiring Foley placement.  During that hospitalization bradycardia was noted for which I was consulted.  He has a long history of bradycardia for at least the last 1 to 2 years.  No syncopal history.  He is very active.  We decided to avoid permanent pacemaker implant given the chronicity of his bradycardia, lack of symptoms and active infection with urinary tract obstruction.  Since leaving the hospital, he has seen urology.  Urology remove the Foley and he passed his voiding trial.  He tells me he continues to have daily dizziness that is worse with exertion.  The feeling of dizziness is almost constant.  The patient has always told me that he has not had a syncopal episode but his family who is with him today reports that he has had at least 3 episodes of losing consciousness.  The patient disputes the fact that this was syncope although the family questions that.       Past Medical History:  Diagnosis Date   Hypertension    Psoriasis    Shingles    Vertigo    Wears dentures    full upper and lower    Past Surgical History:  Procedure Laterality Date   ESOPHAGOGASTRODUODENOSCOPY     many yrs ago    Current Medications: Current Meds  Medication Sig   atorvastatin (LIPITOR) 20 MG tablet Take 1 tablet (20 mg total) by mouth at bedtime.   gabapentin (NEURONTIN) 300 MG capsule Take 1 capsule (300 mg total) by mouth 2 (two) times daily.     Allergies:   Patient has no known allergies.   Social History   Socioeconomic History   Marital status: Widowed    Spouse name: Not on  file   Number of children: Not on file   Years of education: Not on file   Highest education level: Not on file  Occupational History   Not on file  Tobacco Use   Smoking status: Never    Passive exposure: Never   Smokeless tobacco: Never  Vaping Use   Vaping Use: Never used  Substance and Sexual Activity   Alcohol use: No   Drug use: No   Sexual activity: Not Currently  Other Topics Concern   Not on file  Social History Narrative   Not on file   Social Determinants of Health   Financial Resource Strain: Not on file  Food Insecurity: Not on file  Transportation Needs: Not on file  Physical Activity: Not on file  Stress: Not on file  Social Connections: Not on file     Family History: The patient's family history includes Heart disease in his father; Stomach cancer in his mother.  ROS:   Please see the history of present illness.    All other systems reviewed and are negative.  EKGs/Labs/Other Studies Reviewed:    The following studies were reviewed today:   EKG:  The ekg ordered today demonstrates sinus bradycardia with a ventricular rate of 46 bpm.  First-degree AV delay.  Recent Labs: 11/29/2021: B Natriuretic Peptide  51.8; Magnesium 2.2; TSH 4.351 11/30/2021: ALT 16 12/01/2021: BUN 10; Creatinine, Ser 0.82; Hemoglobin 10.7; Platelets 127; Potassium 3.7; Sodium 134  Recent Lipid Panel No results found for: "CHOL", "TRIG", "HDL", "CHOLHDL", "VLDL", "LDLCALC", "LDLDIRECT"  Physical Exam:    VS:  BP (!) 146/58 (BP Location: Left Arm, Patient Position: Sitting, Cuff Size: Normal)   Pulse (!) 46   Ht '5\' 5"'$  (1.651 m)   Wt 147 lb 12.8 oz (67 kg)   SpO2 98%   BMI 24.60 kg/m     Wt Readings from Last 3 Encounters:  12/20/21 147 lb 12.8 oz (67 kg)  12/07/21 142 lb (64.4 kg)  12/01/21 137 lb 12.6 oz (62.5 kg)     GEN:  Well nourished, well developed in no acute distress.  Appears younger than stated age 37: Normal NECK: No JVD; No carotid  bruits LYMPHATICS: No lymphadenopathy CARDIAC: Bradycardic, regular rhythm, no murmurs, rubs, gallops RESPIRATORY:  Clear to auscultation without rales, wheezing or rhonchi  ABDOMEN: Soft, non-tender, non-distended MUSCULOSKELETAL:  No edema; No deformity  SKIN: Warm and dry NEUROLOGIC:  Alert and oriented x 3 PSYCHIATRIC:  Normal affect        ASSESSMENT:    1. Sinus bradycardia   2. Acute urinary retention   3. Aortic atherosclerosis (HCC)    PLAN:    In order of problems listed above:   #Sinus bradycardia Symptomatic with dizziness.  Poor chronotropic.  I discussed permanent pacemaker for improved chronotropic he and improved symptoms and he is interested in proceeding.  I discussed the pacemaker procedure in detail including the risks.    The patient will not allow blood transfusion.  We discussed how this does increase the risk of the procedure slightly.  We discussed the risk of significant bleeding during pacemaker procedures being extremely low.  He is very clear that if there was an emergency he would not accept blood products.  Risks, benefits, alternatives to PPM implantation were discussed in detail with the patient today. The patient understands that the risks include but are not limited to bleeding, infection, pneumothorax, perforation, tamponade, vascular damage, renal failure, MI, stroke, death, and lead dislodgement and wishes to proceed.  We will therefore schedule device implantation at the next available time.  Will plan for a Medtronic dual-chamber permanent pacemaker  #Urinary retention Resolved Follows with urology  #Hypertension Above goal Will not start any antihypertensives today given the bradycardia.  Will reevaluate blood pressure control after pacemaker is in place.    Total time spent with patient today 45 minutes. This includes reviewing records, evaluating the patient and coordinating care.   Medication Adjustments/Labs and Tests  Ordered: Current medicines are reviewed at length with the patient today.  Concerns regarding medicines are outlined above.  No orders of the defined types were placed in this encounter.  No orders of the defined types were placed in this encounter.    Signed, Lars Mage, MD, [facility], [facility] 12/20/2021 11:00 AM    Electrophysiology [facility]

## 2021-12-20 NOTE — H&P (View-Only) (Signed)
Electrophysiology Office Follow up Visit Note:    Date:  12/20/2021   ID:  Shane Vazquez, DOB January 17, 1930, MRN [id number]  PCP:  Patient, No Pcp Per  [place] Cardiologist:  None  [facility] Electrophysiologist:  Vickie Epley, MD    Interval History:    Shane Vazquez is a 88 y.o. male who presents for a follow up visit.  He was hospitalized last month for bladder outflow tract obstruction requiring Foley placement.  During that hospitalization bradycardia was noted for which I was consulted.  He has a long history of bradycardia for at least the last 1 to 2 years.  No syncopal history.  He is very active.  We decided to avoid permanent pacemaker implant given the chronicity of his bradycardia, lack of symptoms and active infection with urinary tract obstruction.  Since leaving the hospital, he has seen urology.  Urology remove the Foley and he passed his voiding trial.  He tells me he continues to have daily dizziness that is worse with exertion.  The feeling of dizziness is almost constant.  The patient has always told me that he has not had a syncopal episode but his family who is with him today reports that he has had at least 3 episodes of losing consciousness.  The patient disputes the fact that this was syncope although the family questions that.       Past Medical History:  Diagnosis Date   Hypertension    Psoriasis    Shingles    Vertigo    Wears dentures    full upper and lower    Past Surgical History:  Procedure Laterality Date   ESOPHAGOGASTRODUODENOSCOPY     many yrs ago    Current Medications: Current Meds  Medication Sig   atorvastatin (LIPITOR) 20 MG tablet Take 1 tablet (20 mg total) by mouth at bedtime.   gabapentin (NEURONTIN) 300 MG capsule Take 1 capsule (300 mg total) by mouth 2 (two) times daily.     Allergies:   Patient has no known allergies.   Social History   Socioeconomic History   Marital status: Widowed    Spouse name: Not on  file   Number of children: Not on file   Years of education: Not on file   Highest education level: Not on file  Occupational History   Not on file  Tobacco Use   Smoking status: Never    Passive exposure: Never   Smokeless tobacco: Never  Vaping Use   Vaping Use: Never used  Substance and Sexual Activity   Alcohol use: No   Drug use: No   Sexual activity: Not Currently  Other Topics Concern   Not on file  Social History Narrative   Not on file   Social Determinants of Health   Financial Resource Strain: Not on file  Food Insecurity: Not on file  Transportation Needs: Not on file  Physical Activity: Not on file  Stress: Not on file  Social Connections: Not on file     Family History: The patient's family history includes Heart disease in his father; Stomach cancer in his mother.  ROS:   Please see the history of present illness.    All other systems reviewed and are negative.  EKGs/Labs/Other Studies Reviewed:    The following studies were reviewed today:   EKG:  The ekg ordered today demonstrates sinus bradycardia with a ventricular rate of 46 bpm.  First-degree AV delay.  Recent Labs: 11/29/2021: B Natriuretic Peptide  51.8; Magnesium 2.2; TSH 4.351 11/30/2021: ALT 16 12/01/2021: BUN 10; Creatinine, Ser 0.82; Hemoglobin 10.7; Platelets 127; Potassium 3.7; Sodium 134  Recent Lipid Panel No results found for: "CHOL", "TRIG", "HDL", "CHOLHDL", "VLDL", "LDLCALC", "LDLDIRECT"  Physical Exam:    VS:  BP (!) 146/58 (BP Location: Left Arm, Patient Position: Sitting, Cuff Size: Normal)   Pulse (!) 46   Ht '5\' 5"'$  (1.651 m)   Wt 147 lb 12.8 oz (67 kg)   SpO2 98%   BMI 24.60 kg/m     Wt Readings from Last 3 Encounters:  12/20/21 147 lb 12.8 oz (67 kg)  12/07/21 142 lb (64.4 kg)  12/01/21 137 lb 12.6 oz (62.5 kg)     GEN:  Well nourished, well developed in no acute distress.  Appears younger than stated age 17: Normal NECK: No JVD; No carotid  bruits LYMPHATICS: No lymphadenopathy CARDIAC: Bradycardic, regular rhythm, no murmurs, rubs, gallops RESPIRATORY:  Clear to auscultation without rales, wheezing or rhonchi  ABDOMEN: Soft, non-tender, non-distended MUSCULOSKELETAL:  No edema; No deformity  SKIN: Warm and dry NEUROLOGIC:  Alert and oriented x 3 PSYCHIATRIC:  Normal affect        ASSESSMENT:    1. Sinus bradycardia   2. Acute urinary retention   3. Aortic atherosclerosis (HCC)    PLAN:    In order of problems listed above:   #Sinus bradycardia Symptomatic with dizziness.  Poor chronotropic.  I discussed permanent pacemaker for improved chronotropic he and improved symptoms and he is interested in proceeding.  I discussed the pacemaker procedure in detail including the risks.    The patient will not allow blood transfusion.  We discussed how this does increase the risk of the procedure slightly.  We discussed the risk of significant bleeding during pacemaker procedures being extremely low.  He is very clear that if there was an emergency he would not accept blood products.  Risks, benefits, alternatives to PPM implantation were discussed in detail with the patient today. The patient understands that the risks include but are not limited to bleeding, infection, pneumothorax, perforation, tamponade, vascular damage, renal failure, MI, stroke, death, and lead dislodgement and wishes to proceed.  We will therefore schedule device implantation at the next available time.  Will plan for a Medtronic dual-chamber permanent pacemaker  #Urinary retention Resolved Follows with urology  #Hypertension Above goal Will not start any antihypertensives today given the bradycardia.  Will reevaluate blood pressure control after pacemaker is in place.    Total time spent with patient today 45 minutes. This includes reviewing records, evaluating the patient and coordinating care.   Medication Adjustments/Labs and Tests  Ordered: Current medicines are reviewed at length with the patient today.  Concerns regarding medicines are outlined above.  No orders of the defined types were placed in this encounter.  No orders of the defined types were placed in this encounter.    Signed, Lars Mage, MD, [facility], [facility] 12/20/2021 11:00 AM    Electrophysiology [facility]

## 2021-12-20 NOTE — Patient Instructions (Addendum)
Medication Instructions:  none *If you need a refill on your cardiac medications before your next appointment, please call your pharmacy*   Lab Work: CBC, BMP If you have labs (blood work) drawn today and your tests are completely normal, you will receive your results only by: [place] (if you have MyChart) OR A paper copy in the mail If you have any lab test that is abnormal or we need to change your treatment, we will call you to review the results.   Testing/Procedures: Your physician has recommended that you have a pacemaker inserted. A pacemaker is a small device that is placed under the skin of your chest or abdomen to help control abnormal heart rhythms. This device uses electrical pulses to prompt the heart to beat at a normal rate. Pacemakers are used to treat heart rhythms that are too slow. Wire (leads) are attached to the pacemaker that goes into the chambers of you heart. This is done in the hospital and usually requires and overnight stay. Please see the instruction sheet given to you today for more information.  Follow-Up: At [facility], you and your health needs are our priority.  As part of our continuing mission to provide you with exceptional heart care, we have created designated Provider Care Teams.  These Care Teams include your primary Cardiologist (physician) and Advanced Practice Providers (APPs -  Physician Assistants and Nurse Practitioners) who all work together to provide you with the care you need, when you need it.  We recommend signing up for the patient portal called "MyChart".  Sign up information is provided on this After Visit Summary.  MyChart is used to connect with patients for Virtual Visits (Telemedicine).  Patients are able to view lab/test results, encounter notes, upcoming appointments, etc.  Non-urgent messages can be sent to your provider as well.   To learn more about what you can do with MyChart, go to [email or web address].    Your  next appointment:   Aug 29 Procedure -You will follow up with the [facility] 10-14 days after your procedure. You will follow up with Dr. Lars Mage 91 days after your procedure.   :1}    Other Instructions Pacemaker Implantation, Adult Pacemaker implantation is a procedure to place a pacemaker inside the chest. A pacemaker is a small computer that sends electrical signals to the heart and helps the heart beat normally. A pacemaker also stores information about heart rhythms. You may need pacemaker implantation if you have: A slow heartbeat (bradycardia). Loss of consciousness that happens repeatedly (syncope) or repeated episodes of dizziness or light-headedness because of an irregular heart rate. Shortness of breath (dyspnea) due to heart problems. The pacemaker usually attaches to your heart through a wire called a lead. One or two leads may be needed. There are different types of pacemakers: Transvenous pacemaker. This type is placed under the skin or muscle of your upper chest area. The lead goes through a vein in the chest area to reach the inside of the heart. Epicardial pacemaker. This type is placed under the skin or muscle of your chest or abdomen. The lead goes through your chest to the outside of the heart. Tell a health care provider about: Any allergies you have. All medicines you are taking, including vitamins, herbs, eye drops, creams, and over-the-counter medicines. Any problems you or family members have had with anesthetic medicines. Any blood or bone disorders you have. Any surgeries you have had. Any medical conditions you have. Whether  you are pregnant or may be pregnant. What are the risks? Generally, this is a safe procedure. However, problems may occur, including: Infection. Bleeding. Failure of the pacemaker or the lead. Collapse of a lung or bleeding into a lung. Blood clot inside a blood vessel with a lead. Damage to the heart. Infection  inside the heart (endocarditis). Allergic reactions to medicines. What happens before the procedure? Staying hydrated Follow instructions from your health care provider about hydration, which may include: Up to 2 hours before the procedure - you may continue to drink clear liquids, such as water, clear fruit juice, black coffee, and plain tea.  Eating and drinking restrictions Follow instructions from your health care provider about eating and drinking, which may include: 8 hours before the procedure - stop eating heavy meals or foods, such as meat, fried foods, or fatty foods. 6 hours before the procedure - stop eating light meals or foods, such as toast or cereal. 6 hours before the procedure - stop drinking milk or drinks that contain milk. 2 hours before the procedure - stop drinking clear liquids. Medicines Ask your health care provider about: Changing or stopping your regular medicines. This is especially important if you are taking diabetes medicines or blood thinners. Taking medicines such as aspirin and ibuprofen. These medicines can thin your blood. Do not take these medicines unless your health care provider tells you to take them. Taking over-the-counter medicines, vitamins, herbs, and supplements. Tests You may have: A heart evaluation. This may include: An electrocardiogram (ECG). This involves placing patches on your skin to check your heart rhythm. A chest X-ray. An echocardiogram. This is a test that uses sound waves (ultrasound) to produce an image of the heart. A cardiac rhythm monitor. This is used to record your heart rhythm and any events for a longer period of time. Blood tests. Genetic testing. General instructions Do not use any products that contain nicotine or tobacco for at least 4 weeks before the procedure. These products include cigarettes, e-cigarettes, and chewing tobacco. If you need help quitting, ask your health care provider. Ask your health care  provider: How your surgery site will be marked. What steps will be taken to help prevent infection. These steps may include: Removing hair at the surgery site. Washing skin with a germ-killing soap. Receiving antibiotic medicine. Plan to have someone take you home from the hospital or clinic. If you will be going home right after the procedure, plan to have someone with you for 24 hours. What happens during the procedure? An IV will be inserted into one of your veins. You will be given one or more of the following: A medicine to help you relax (sedative). A medicine to numb the area (local anesthetic). A medicine to make you fall asleep (general anesthetic). The next steps vary depending on the type of pacemaker you will be getting. If you are getting a transvenous pacemaker: An incision will be made in your upper chest. A pocket will be made for the pacemaker. It may be placed under the skin or between layers of muscle. The lead will be inserted into a blood vessel that goes to the heart. While X-rays are taken by an imaging machine (fluoroscopy), the lead will be advanced through the vein to the inside of your heart. The other end of the lead will be tunneled under the skin and attached to the pacemaker. If you are getting an epicardial pacemaker: An incision will be made near your ribs or breastbone (  sternum) for the lead. The lead will be attached to the outside of your heart. Another incision will be made in your chest or upper abdomen to create a pocket for the pacemaker. The free end of the lead will be tunneled under the skin and attached to the pacemaker. The transvenous or epicardial pacemaker will be tested. Imaging studies may be done to check the lead position. The incisions will be closed with stitches (sutures), adhesive strips, or skin glue. Bandages (dressings) will be placed over the incisions. The procedure may vary among health care providers and hospitals. What  happens after the procedure? Your blood pressure, heart rate, breathing rate, and blood oxygen level will be monitored until you leave the hospital or clinic. You may be given antibiotics. You will be given pain medicine. An ECG and chest X-rays will be done. You may need to wear a continuous type of ECG (Holter monitor) to check your heart rhythm. Your health care provider will program the pacemaker. If you were given a sedative during the procedure, it can affect you for several hours. Do not drive or operate machinery until your health care provider says that it is safe. You will be given a pacemaker identification card. This card lists the implant date, device model, and manufacturer of your pacemaker. Summary A pacemaker is a small computer that sends electrical signals to the heart and helps the heart beat normally. There are different types of pacemakers. A pacemaker may be placed under the skin or muscle of your chest or abdomen. Follow instructions from your health care provider about eating and drinking and about taking medicines before the procedure. This information is not intended to replace advice given to you by your health care provider. Make sure you discuss any questions you have with your health care provider. Document Revised: 01/10/2021 Document Reviewed: 04/01/2019 Elsevier Patient Education  [employer]

## 2021-12-21 NOTE — Addendum Note (Signed)
Addended by: Anselm Pancoast on: 12/21/2021 02:36 PM   Modules accepted: Orders

## 2022-01-08 NOTE — Pre-Procedure Instructions (Signed)
Spoke with patient's daughter Beverlee Nims.  She will be the one bringin the patient for his procedure.  Informed them of the time change.  Instructed her on the following items: Arrival time 1130 Nothing to eat or drink after midnight No meds AM of procedure Responsible person to drive you home and stay with you for 24 hrs Wash with special soap night before and morning of procedure

## 2022-01-09 ENCOUNTER — Ambulatory Visit (HOSPITAL_COMMUNITY)
Admission: RE | Admit: 2022-01-09 | Discharge: 2022-01-09 | Disposition: A | Discharge status: Home / Self Care | Payer: Medicare HMO | Source: Ambulatory Visit | Attending: Cardiology | Admitting: Cardiology

## 2022-01-09 ENCOUNTER — Ambulatory Visit (HOSPITAL_COMMUNITY): Payer: Medicare HMO

## 2022-01-09 ENCOUNTER — Encounter (HOSPITAL_COMMUNITY)
Admission: RE | Disposition: A | Discharge status: Home / Self Care | Payer: Self-pay | Source: Ambulatory Visit | Attending: Cardiology

## 2022-01-09 ENCOUNTER — Ambulatory Visit: Payer: Medicare HMO | Admitting: Urology

## 2022-01-09 ENCOUNTER — Other Ambulatory Visit: Payer: Self-pay

## 2022-01-09 DIAGNOSIS — R42 Dizziness and giddiness: Secondary | ICD-10-CM | POA: Insufficient documentation

## 2022-01-09 DIAGNOSIS — I10 Essential (primary) hypertension: Secondary | ICD-10-CM | POA: Diagnosis not present

## 2022-01-09 DIAGNOSIS — I700 Atherosclerosis of aorta: Secondary | ICD-10-CM | POA: Insufficient documentation

## 2022-01-09 DIAGNOSIS — R339 Retention of urine, unspecified: Secondary | ICD-10-CM | POA: Insufficient documentation

## 2022-01-09 DIAGNOSIS — R001 Bradycardia, unspecified: Secondary | ICD-10-CM | POA: Insufficient documentation

## 2022-01-09 DIAGNOSIS — I495 Sick sinus syndrome: Secondary | ICD-10-CM | POA: Diagnosis not present

## 2022-01-09 HISTORY — PX: PACEMAKER IMPLANT: EP1218

## 2022-01-09 LAB — BASIC METABOLIC PANEL
Anion gap: 5 (ref 5–15)
BUN: 11 mg/dL (ref 8–23)
CO2: 28 mmol/L (ref 22–32)
Calcium: 9.1 mg/dL (ref 8.9–10.3)
Chloride: 105 mmol/L (ref 98–111)
Creatinine, Ser: 0.98 mg/dL (ref 0.61–1.24)
GFR, Estimated: >60 mL/min (ref >60)
Glucose, Bld: 99 mg/dL (ref 70–99)
Potassium: 4.2 mmol/L (ref 3.5–5.1)
Sodium: 138 mmol/L (ref 135–145)

## 2022-01-09 LAB — NO BLOOD PRODUCTS

## 2022-01-09 SURGERY — PACEMAKER IMPLANT

## 2022-01-09 MED ORDER — HYDRALAZINE HCL 20 MG/ML IJ SOLN
INTRAMUSCULAR | Status: AC
  Filled 2022-01-09: qty 1

## 2022-01-09 MED ORDER — FENTANYL CITRATE (PF) 100 MCG/2ML IJ SOLN
INTRAMUSCULAR | Status: DC | PRN
  Administered 2022-01-09: 25 ug via INTRAVENOUS

## 2022-01-09 MED ORDER — CHLORHEXIDINE GLUCONATE 4 % EX LIQD: 4.0000 | Freq: Once | CUTANEOUS | Status: DC

## 2022-01-09 MED ORDER — CEFAZOLIN SODIUM-DEXTROSE 2-4 GM/100ML-% IV SOLN
INTRAVENOUS | Status: AC
  Filled 2022-01-09: qty 100

## 2022-01-09 MED ORDER — LIDOCAINE HCL (PF) 1 % IJ SOLN
INTRAMUSCULAR | Status: AC
  Filled 2022-01-09: qty 60

## 2022-01-09 MED ORDER — SODIUM CHLORIDE 0.9 % IV SOLN: INTRAVENOUS | Status: DC

## 2022-01-09 MED ORDER — HYDRALAZINE HCL 20 MG/ML IJ SOLN
INTRAMUSCULAR | Status: DC | PRN
  Administered 2022-01-09: 10 mg via INTRAVENOUS

## 2022-01-09 MED ORDER — FENTANYL CITRATE (PF) 100 MCG/2ML IJ SOLN
INTRAMUSCULAR | Status: AC
  Filled 2022-01-09: qty 2

## 2022-01-09 MED ORDER — SODIUM CHLORIDE 0.9 % IV SOLN
80.0000 mg | INTRAVENOUS | Status: AC
  Administered 2022-01-09: 80 mg

## 2022-01-09 MED ORDER — ONDANSETRON HCL 4 MG/2ML IJ SOLN
4.0000 mg | Freq: Four times a day (QID) | INTRAMUSCULAR | Status: DC | PRN
Start: 2022-01-09 — End: 2022-01-10

## 2022-01-09 MED ORDER — ACETAMINOPHEN 325 MG PO TABS: 325.0000 mg | ORAL_TABLET | ORAL | Status: DC | PRN

## 2022-01-09 MED ORDER — SODIUM CHLORIDE 0.9 % IV SOLN
INTRAVENOUS | Status: AC
  Filled 2022-01-09: qty 2

## 2022-01-09 MED ORDER — HEPARIN (PORCINE) IN NACL 1000-0.9 UT/500ML-% IV SOLN
INTRAVENOUS | Status: DC | PRN
  Administered 2022-01-09: 500 mL

## 2022-01-09 MED ORDER — CEFAZOLIN SODIUM-DEXTROSE 2-4 GM/100ML-% IV SOLN
2.0000 g | INTRAVENOUS | Status: AC
  Administered 2022-01-09: 2 g via INTRAVENOUS

## 2022-01-09 MED ORDER — MIDAZOLAM HCL 5 MG/5ML IJ SOLN
INTRAMUSCULAR | Status: DC | PRN
  Administered 2022-01-09: 1 mg via INTRAVENOUS

## 2022-01-09 MED ORDER — POVIDONE-IODINE 10 % EX SWAB: 2.0000 | Freq: Once | CUTANEOUS | Status: DC

## 2022-01-09 MED ORDER — LIDOCAINE HCL (PF) 1 % IJ SOLN
INTRAMUSCULAR | Status: DC | PRN
  Administered 2022-01-09: 60 mL

## 2022-01-09 MED ORDER — MIDAZOLAM HCL 5 MG/5ML IJ SOLN
INTRAMUSCULAR | Status: AC
  Filled 2022-01-09: qty 5

## 2022-01-09 SURGICAL SUPPLY — 13 items
CABLE SURGICAL S-101-97-12 (CABLE) ×1 IMPLANT
CATH RIGHTSITE C315HIS02 (CATHETERS) IMPLANT
IPG PACE AZUR XT DR MRI W1DR01 (Pacemaker) IMPLANT
LEAD CAPSURE NOVUS 5076-52CM (Lead) IMPLANT
LEAD SELECT SECURE 3830 383069 (Lead) IMPLANT
PACE AZURE XT DR MRI W1DR01 (Pacemaker) ×1 IMPLANT
PAD DEFIB RADIO PHYSIO CONN (PAD) ×1 IMPLANT
SELECT SECURE 3830 383069 (Lead) ×1 IMPLANT
SHEATH 7FR PRELUDE SNAP 13 (SHEATH) IMPLANT
SHEATH PROBE COVER 6X72 (BAG) IMPLANT
SLITTER 6232ADJ (MISCELLANEOUS) IMPLANT
TRAY PACEMAKER INSERTION (PACKS) ×1 IMPLANT
WIRE HI TORQ VERSACORE-J 145CM (WIRE) IMPLANT

## 2022-01-09 NOTE — Progress Notes (Addendum)
Pt assisted with redress and left UE shoulder immobilizer-interpreter and daughter with pt at d/c-d/c instructions reviewed with daughter who speaks english/spanish

## 2022-01-09 NOTE — Discharge Instructions (Addendum)
    Supplemental Discharge Instructions for  Pacemaker/Defibrillator Patients  Tomorrow, 02/10/22, send in a device transmission  Activity No heavy lifting or vigorous activity with your left/right arm for 6 to 8 weeks.  Do not raise your left/right arm above your head for one week.  Gradually raise your affected arm as drawn below.              01/14/22                     01/15/22                      01/16/22                    01/17/22 __  NO DRIVING for  1 week ; you may begin driving on   05/20/77  .  WOUND CARE Keep the wound area clean and dry.  Do not get this area wet , no showers for one week; you may shower on     . Tomorrow, 01/10/22, remove the arm sling Tomorrow, 02/10/22 remove the LARGE outer plastic bandage.  Underneath the plastic bandage there are steri strips (paper tapes), DO NOT remove these. The tape/steri-strips on your wound will fall off; do not pull them off.  No bandage is needed on the site.  DO  NOT apply any creams, oils, or ointments to the wound area. If you notice any drainage or discharge from the wound, any swelling or bruising at the site, or you develop a fever > 101? F after you are discharged home, call the office at once.  Special Instructions You are still able to use cellular telephones; use the ear opposite the side where you have your pacemaker/defibrillator.  Avoid carrying your cellular phone near your device. When traveling through airports, show security personnel your identification card to avoid being screened in the metal detectors.  Ask the security personnel to use the hand wand. Avoid arc welding equipment, MRI testing (magnetic resonance imaging), TENS units (transcutaneous nerve stimulators).  Call the office for questions about other devices. Avoid electrical appliances that are in poor condition or are not properly grounded. Microwave ovens are safe to be near or to operate.

## 2022-01-09 NOTE — Progress Notes (Signed)
Dr Quentin Ore notified of post CXR-advised to proceed with 1900 d/c

## 2022-01-09 NOTE — Progress Notes (Signed)
Received report from [place], RN-pt resting-NAD-interpreter at [facility]

## 2022-01-09 NOTE — Interval H&P Note (Signed)
History and Physical Interval Note:  01/09/2022 12:31 PM  Shane Vazquez  has presented today for surgery, with the diagnosis of bradycardia.  The various methods of treatment have been discussed with the patient and family. After consideration of risks, benefits and other options for treatment, the patient has consented to  Procedure(s): PACEMAKER IMPLANT (N/A) as a surgical intervention.  The patient's history has been reviewed, patient examined, no change in status, stable for surgery.  I have reviewed the patient's chart and labs.  Questions were answered to the patient's satisfaction.     Donalda Job T Dori Devino

## 2022-01-10 ENCOUNTER — Encounter (HOSPITAL_COMMUNITY): Payer: Self-pay | Admitting: Cardiology

## 2022-01-10 ENCOUNTER — Telehealth: Payer: Self-pay

## 2022-01-10 NOTE — Telephone Encounter (Signed)
Follow-up after same day discharge: Implant date: 01/09/2022 MD: Dr. Quentin Ore  Device: MDT PPM Location: L Chest    Wound check visit: 01/18/2022 90 day MD follow-up: 04/18/2022  Remote Transmission received:No patients daughter stated she would check on home monitor when she see patient this afternoon/evening.   Dressing/sling removed: Y  Confirm [place] restart on: NA  Reinforced to patient's daughter lifting restrictions , direct number to device clinic given for any questions

## 2022-01-10 NOTE — Telephone Encounter (Signed)
-----   Message from [place], [place] sent at 01/09/2022  3:07 PM EDT ----- Same day d/c  CL  MDT PPM

## 2022-01-18 ENCOUNTER — Ambulatory Visit: Payer: Medicare HMO | Attending: Interventional Cardiology

## 2022-01-18 DIAGNOSIS — R001 Bradycardia, unspecified: Secondary | ICD-10-CM | POA: Diagnosis not present

## 2022-01-18 LAB — CUP PACEART INCLINIC DEVICE CHECK
Battery Remaining Longevity: 114 mo
Battery Voltage: 3.21 V
Brady Statistic AP VP Percent: 99.58 %
Brady Statistic AP VS Percent: 0.07 %
Brady Statistic AS VP Percent: 0.35 %
Brady Statistic AS VS Percent: 0.01 %
Brady Statistic RA Percent Paced: 99.65 %
Brady Statistic RV Percent Paced: 99.93 %
Date Time Interrogation Session: 20230907101300
Implantable Lead Implant Date: 20230829
Implantable Lead Implant Date: 20230829
Implantable Lead Location: 753859
Implantable Lead Location: 753860
Implantable Lead Model: 3830
Implantable Lead Model: 5076
Implantable Pulse Generator Implant Date: 20230829
Lead Channel Impedance Value: 285 Ohm
Lead Channel Impedance Value: 418 Ohm
Lead Channel Impedance Value: 437 Ohm
Lead Channel Impedance Value: 627 Ohm
Lead Channel Pacing Threshold Amplitude: 0.625 V
Lead Channel Pacing Threshold Amplitude: 0.75 V
Lead Channel Pacing Threshold Pulse Width: 0.4 ms
Lead Channel Pacing Threshold Pulse Width: 0.4 ms
Lead Channel Sensing Intrinsic Amplitude: 1.125 mV
Lead Channel Sensing Intrinsic Amplitude: 1.125 mV
Lead Channel Sensing Intrinsic Amplitude: 28.375 mV
Lead Channel Setting Pacing Amplitude: 3.5 V
Lead Channel Setting Pacing Amplitude: 3.5 V
Lead Channel Setting Pacing Pulse Width: 0.4 ms
Lead Channel Setting Sensing Sensitivity: 0.9 mV

## 2022-01-18 NOTE — Patient Instructions (Signed)

## 2022-01-18 NOTE — Progress Notes (Signed)

## 2022-01-25 ENCOUNTER — Other Ambulatory Visit: Payer: Self-pay | Admitting: Nurse Practitioner

## 2022-01-25 NOTE — Telephone Encounter (Signed)
Patient does not have primary cardiologist. OK to fill under Dr. Quentin Ore or defer to PCP? Thanks!

## 2022-03-01 ENCOUNTER — Telehealth: Payer: Self-pay | Admitting: Cardiology

## 2022-03-01 NOTE — Telephone Encounter (Signed)
Spoke with daugther.  Her dad lives in [place] with her brother Shane Vazquez.  Shane Vazquez's contact number is [phone].  She is going to call her brother and have him send [place] a transmission.

## 2022-03-01 NOTE — Telephone Encounter (Signed)
Spoke with son, Wynetta Fines.  He is going to send [place] a transmission when he gets home to father around 53pm.   States his father is in great shape, he exercises daily and is in great shape and has been feeling fine but has noted some increased dizziness and "heavy in feet" at times in the evenings lately. Son states dad reports having plenty of energy during his work out periods. BP's running 140's/50-70.  (Appears baseline per last OV note).  But does have pattern of hypotension and bradycardia.  Son says BP's fluctuate up and down.  They will continue to monitor and record his readings daily.   Denies any CP, SOB or CVA like symptoms.  He is seeing his primary doctor tomorrow and I asked son to please make them aware of his symptoms, may need blood work and to ensure patient is drinking plenty of fluids especially if he is exercising often and very active.   We will assess remote transmission when it comes in.    Patient is in process of transferring his cardiology/EP care over to [place] but is not able to get in with new specialists there until January.  Son states that he is planning on sending patient back to [place] in [place] for his November appointment before he sees [place] cardiac team in January.   Son knows if patient's symptoms get worse with dizziness or develops any discussed emergency sx's that patient should go to the ER for evaluation.  Son verbalizes understanding.

## 2022-03-01 NOTE — Telephone Encounter (Signed)
Spoke with son again and talked him through a transmission.  Transmission received was normal.  Nothing to explain his symptoms of increased dizziness and "heaviness in feet" later in the day.   He is very active.  Currently on the phone patient feels tired but no urgent/emergent sxs. Patient sees PCP tomorrow and son will discuss how patient is feeling at that time.   Son is concerned that the PCP may restart patient's amlodipine and losartan.  He will call our office to make EP aware if that happens.

## 2022-03-01 NOTE — Telephone Encounter (Signed)
STAT if patient feels like he/she is going to faint   Are you dizzy now? no  Do you feel faint or have you passed out? no  Do you have any other symptoms? Heavy in the legs   Have you checked your HR and BP (record if available)?   Pt daughter called stating that pt has been feeling very dizzy and feelings heavy in the legs. She states he is in [place] and wants to know what the pt should do

## 2022-03-05 NOTE — Telephone Encounter (Signed)
Patient pcp has called and let us know he is going to just be on losartan

## 2022-04-11 ENCOUNTER — Ambulatory Visit (INDEPENDENT_AMBULATORY_CARE_PROVIDER_SITE_OTHER): Payer: Medicare HMO

## 2022-04-11 DIAGNOSIS — R001 Bradycardia, unspecified: Secondary | ICD-10-CM

## 2022-04-11 LAB — CUP PACEART REMOTE DEVICE CHECK
Battery Remaining Longevity: 137 mo
Battery Voltage: 3.18 V
Brady Statistic AP VP Percent: 99.22 %
Brady Statistic AP VS Percent: 0.05 %
Brady Statistic AS VP Percent: 0.73 %
Brady Statistic AS VS Percent: 0.01 %
Brady Statistic RA Percent Paced: 99.26 %
Brady Statistic RV Percent Paced: 99.95 %
Date Time Interrogation Session: 20231128180959
Implantable Lead Connection Status: 753985
Implantable Lead Connection Status: 753985
Implantable Lead Implant Date: 20230829
Implantable Lead Implant Date: 20230829
Implantable Lead Location: 753859
Implantable Lead Location: 753860
Implantable Lead Model: 3830
Implantable Lead Model: 5076
Implantable Pulse Generator Implant Date: 20230829
Lead Channel Impedance Value: 304 Ohm
Lead Channel Impedance Value: 380 Ohm
Lead Channel Impedance Value: 456 Ohm
Lead Channel Impedance Value: 665 Ohm
Lead Channel Pacing Threshold Amplitude: 0.5 V
Lead Channel Pacing Threshold Amplitude: 0.75 V
Lead Channel Pacing Threshold Pulse Width: 0.4 ms
Lead Channel Pacing Threshold Pulse Width: 0.4 ms
Lead Channel Sensing Intrinsic Amplitude: 28.375 mV
Lead Channel Sensing Intrinsic Amplitude: 3 mV
Lead Channel Sensing Intrinsic Amplitude: 3 mV
Lead Channel Setting Pacing Amplitude: 1.5 V
Lead Channel Setting Pacing Amplitude: 2 V
Lead Channel Setting Pacing Pulse Width: 0.4 ms
Lead Channel Setting Sensing Sensitivity: 0.9 mV
Zone Setting Status: 755011
Zone Setting Status: 755011

## 2022-04-17 NOTE — Progress Notes (Deleted)
Electrophysiology Office Follow up Visit Note:    Date:  04/17/2022   ID:  Shane Vazquez, DOB 06-Apr-1930, MRN [id number]  PCP:  Patient, No Pcp Per  [facility] Cardiologist:  None  [facility] Electrophysiologist:  Vickie Epley, MD    Interval History:    Shane Vazquez is a 88 y.o. male who presents for a follow up visit. He had a DDD PPM implanted 01/09/2022. He has done well after implant. Remote interrogations have shown stable device function.        Past Medical History:  Diagnosis Date   Hypertension    Psoriasis    Shingles    Vertigo    Wears dentures    full upper and lower    Past Surgical History:  Procedure Laterality Date   ESOPHAGOGASTRODUODENOSCOPY     many yrs ago   PACEMAKER IMPLANT N/A 01/09/2022   Procedure: PACEMAKER IMPLANT;  Surgeon: Vickie Epley, MD;  Location: [facility];  Service: Cardiovascular;  Laterality: N/A;    Current Medications: No outpatient medications have been marked as taking for the 04/18/22 encounter (Appointment) with Vickie Epley, MD.     Allergies:   Patient has no known allergies.   Social History   Socioeconomic History   Marital status: Widowed    Spouse name: Not on file   Number of children: Not on file   Years of education: Not on file   Highest education level: Not on file  Occupational History   Not on file  Tobacco Use   Smoking status: Never    Passive exposure: Never   Smokeless tobacco: Never  Vaping Use   Vaping Use: Never used  Substance and Sexual Activity   Alcohol use: No   Drug use: No   Sexual activity: Not Currently  Other Topics Concern   Not on file  Social History Narrative   Not on file   Social Determinants of Health   Financial Resource Strain: Not on file  Food Insecurity: Not on file  Transportation Needs: Not on file  Physical Activity: Not on file  Stress: Not on file  Social Connections: Not on file     Family History: The patient's family  history includes Heart disease in his father; Stomach cancer in his mother.  ROS:   Please see the history of present illness.    All other systems reviewed and are negative.  EKGs/Labs/Other Studies Reviewed:    The following studies were reviewed today:  04/18/2022 in clinic device interrogation personally reviewed ***    Recent Labs: 11/29/2021: B Natriuretic Peptide 51.8; Magnesium 2.2; TSH 4.351 11/30/2021: ALT 16 12/20/2021: Hemoglobin 11.3; Platelets 154 01/09/2022: BUN 11; Creatinine, Ser 0.98; Potassium 4.2; Sodium 138  Recent Lipid Panel No results found for: "CHOL", "TRIG", "HDL", "CHOLHDL", "VLDL", "LDLCALC", "LDLDIRECT"  Physical Exam:    VS:  There were no vitals taken for this visit.    Wt Readings from Last 3 Encounters:  01/09/22 165 lb (74.8 kg)  12/20/21 147 lb 12.8 oz (67 kg)  12/07/21 142 lb (64.4 kg)     GEN: *** Well nourished, well developed in no acute distress HEENT: Normal NECK: No JVD; No carotid bruits LYMPHATICS: No lymphadenopathy CARDIAC: ***RRR, no murmurs, rubs, gallops. Prepectoral pocket well healed. RESPIRATORY:  Clear to auscultation without rales, wheezing or rhonchi  ABDOMEN: Soft, non-tender, non-distended MUSCULOSKELETAL:  No edema; No deformity  SKIN: Warm and dry NEUROLOGIC:  Alert and oriented x 3 PSYCHIATRIC:  Normal affect        ASSESSMENT:    No diagnosis found. PLAN:    In order of problems listed above:  #Symptomatic sinus bradycardia #PPM in situ Device functioning appropriately, continue remote monitoring.  #Hypertension *** goal today.  Recommend checking blood pressures 1-2 times per week at home and recording the values.  Recommend bringing these recordings to the primary care physician.          Total time spent with patient today *** minutes. This includes reviewing records, evaluating the patient and coordinating care.   Medication Adjustments/Labs and Tests Ordered: Current medicines are  reviewed at length with the patient today.  Concerns regarding medicines are outlined above.  No orders of the defined types were placed in this encounter.  No orders of the defined types were placed in this encounter.    Signed, Lars Mage, MD, [facility], [facility] 04/17/2022 9:13 PM    Electrophysiology [facility]

## 2022-04-18 ENCOUNTER — Ambulatory Visit: Payer: Medicare HMO | Attending: Cardiology | Admitting: Cardiology

## 2022-04-18 DIAGNOSIS — Z950 Presence of cardiac pacemaker: Secondary | ICD-10-CM

## 2022-04-18 DIAGNOSIS — I10 Essential (primary) hypertension: Secondary | ICD-10-CM

## 2022-04-18 DIAGNOSIS — R001 Bradycardia, unspecified: Secondary | ICD-10-CM

## 2022-04-19 ENCOUNTER — Encounter: Payer: Self-pay | Admitting: Cardiology

## 2022-05-08 NOTE — Progress Notes (Signed)
Remote pacemaker transmission.   

## 2022-07-11 ENCOUNTER — Ambulatory Visit: Payer: Medicare HMO

## 2022-07-11 DIAGNOSIS — R001 Bradycardia, unspecified: Secondary | ICD-10-CM

## 2022-07-11 LAB — CUP PACEART REMOTE DEVICE CHECK
Battery Remaining Longevity: 127 mo
Battery Voltage: 3.12 V
Brady Statistic AP VP Percent: 99.61 %
Brady Statistic AP VS Percent: 0.03 %
Brady Statistic AS VP Percent: 0.36 %
Brady Statistic AS VS Percent: 0 %
Brady Statistic RA Percent Paced: 99.64 %
Brady Statistic RV Percent Paced: 99.96 %
Date Time Interrogation Session: 20240228034044
Implantable Lead Connection Status: 753985
Implantable Lead Connection Status: 753985
Implantable Lead Implant Date: 20230829
Implantable Lead Implant Date: 20230829
Implantable Lead Location: 753859
Implantable Lead Location: 753860
Implantable Lead Model: 3830
Implantable Lead Model: 5076
Implantable Pulse Generator Implant Date: 20230829
Lead Channel Impedance Value: 285 Ohm
Lead Channel Impedance Value: 380 Ohm
Lead Channel Impedance Value: 437 Ohm
Lead Channel Impedance Value: 646 Ohm
Lead Channel Pacing Threshold Amplitude: 0.5 V
Lead Channel Pacing Threshold Amplitude: 1.125 V
Lead Channel Pacing Threshold Pulse Width: 0.4 ms
Lead Channel Pacing Threshold Pulse Width: 0.4 ms
Lead Channel Sensing Intrinsic Amplitude: 28.375 mV
Lead Channel Sensing Intrinsic Amplitude: 3.125 mV
Lead Channel Sensing Intrinsic Amplitude: 3.125 mV
Lead Channel Setting Pacing Amplitude: 1.5 V
Lead Channel Setting Pacing Amplitude: 2.25 V
Lead Channel Setting Pacing Pulse Width: 0.4 ms
Lead Channel Setting Sensing Sensitivity: 0.9 mV
Zone Setting Status: 755011
Zone Setting Status: 755011

## 2022-08-14 NOTE — Progress Notes (Signed)
Remote pacemaker transmission.   

## 2022-10-10 ENCOUNTER — Ambulatory Visit (INDEPENDENT_AMBULATORY_CARE_PROVIDER_SITE_OTHER): Payer: Medicare HMO

## 2022-10-10 DIAGNOSIS — R001 Bradycardia, unspecified: Secondary | ICD-10-CM | POA: Diagnosis not present

## 2022-10-11 LAB — CUP PACEART REMOTE DEVICE CHECK
Battery Remaining Longevity: 124 mo
Battery Voltage: 3.06 V
Brady Statistic AP VP Percent: 98.79 %
Brady Statistic AP VS Percent: 0.04 %
Brady Statistic AS VP Percent: 1.16 %
Brady Statistic AS VS Percent: 0.01 %
Brady Statistic RA Percent Paced: 98.83 %
Brady Statistic RV Percent Paced: 99.95 %
Date Time Interrogation Session: 20240529024546
Implantable Lead Connection Status: 753985
Implantable Lead Connection Status: 753985
Implantable Lead Implant Date: 20230829
Implantable Lead Implant Date: 20230829
Implantable Lead Location: 753859
Implantable Lead Location: 753860
Implantable Lead Model: 3830
Implantable Lead Model: 5076
Implantable Pulse Generator Implant Date: 20230829
Lead Channel Impedance Value: 285 Ohm
Lead Channel Impedance Value: 361 Ohm
Lead Channel Impedance Value: 437 Ohm
Lead Channel Impedance Value: 646 Ohm
Lead Channel Pacing Threshold Amplitude: 0.5 V
Lead Channel Pacing Threshold Amplitude: 1.125 V
Lead Channel Pacing Threshold Pulse Width: 0.4 ms
Lead Channel Pacing Threshold Pulse Width: 0.4 ms
Lead Channel Sensing Intrinsic Amplitude: 2.25 mV
Lead Channel Sensing Intrinsic Amplitude: 2.25 mV
Lead Channel Sensing Intrinsic Amplitude: 31.625 mV
Lead Channel Setting Pacing Amplitude: 1.5 V
Lead Channel Setting Pacing Amplitude: 2.25 V
Lead Channel Setting Pacing Pulse Width: 0.4 ms
Lead Channel Setting Sensing Sensitivity: 0.9 mV
Zone Setting Status: 755011
Zone Setting Status: 755011

## 2022-10-13 DEATH — deceased

## 2022-11-01 NOTE — Progress Notes (Signed)
Remote pacemaker transmission.   

## 2023-01-09 ENCOUNTER — Ambulatory Visit (INDEPENDENT_AMBULATORY_CARE_PROVIDER_SITE_OTHER): Payer: Medicare HMO

## 2023-01-09 DIAGNOSIS — R001 Bradycardia, unspecified: Secondary | ICD-10-CM | POA: Diagnosis not present

## 2023-01-09 LAB — CUP PACEART REMOTE DEVICE CHECK
Battery Remaining Longevity: 120 mo
Battery Voltage: 3.04 V
Brady Statistic AP VP Percent: 98.15 %
Brady Statistic AP VS Percent: 0.04 %
Brady Statistic AS VP Percent: 1.81 %
Brady Statistic AS VS Percent: 0.01 %
Brady Statistic RA Percent Paced: 98.18 %
Brady Statistic RV Percent Paced: 99.96 %
Date Time Interrogation Session: 20240828010237
Implantable Lead Connection Status: 753985
Implantable Lead Connection Status: 753985
Implantable Lead Implant Date: 20230829
Implantable Lead Implant Date: 20230829
Implantable Lead Location: 753859
Implantable Lead Location: 753860
Implantable Lead Model: 3830
Implantable Lead Model: 5076
Implantable Pulse Generator Implant Date: 20230829
Lead Channel Impedance Value: 285 Ohm
Lead Channel Impedance Value: 361 Ohm
Lead Channel Impedance Value: 437 Ohm
Lead Channel Impedance Value: 627 Ohm
Lead Channel Pacing Threshold Amplitude: 0.5 V
Lead Channel Pacing Threshold Amplitude: 1.125 V
Lead Channel Pacing Threshold Pulse Width: 0.4 ms
Lead Channel Pacing Threshold Pulse Width: 0.4 ms
Lead Channel Sensing Intrinsic Amplitude: 3.25 mV
Lead Channel Sensing Intrinsic Amplitude: 3.25 mV
Lead Channel Sensing Intrinsic Amplitude: 31.625 mV
Lead Channel Setting Pacing Amplitude: 1.5 V
Lead Channel Setting Pacing Amplitude: 2.25 V
Lead Channel Setting Pacing Pulse Width: 0.4 ms
Lead Channel Setting Sensing Sensitivity: 0.9 mV
Zone Setting Status: 755011
Zone Setting Status: 755011

## 2023-01-17 NOTE — Progress Notes (Signed)
Remote pacemaker transmission.   

## 2023-04-10 ENCOUNTER — Ambulatory Visit (INDEPENDENT_AMBULATORY_CARE_PROVIDER_SITE_OTHER): Payer: Medicare HMO

## 2023-04-10 DIAGNOSIS — R001 Bradycardia, unspecified: Secondary | ICD-10-CM

## 2023-04-10 LAB — CUP PACEART REMOTE DEVICE CHECK
Battery Remaining Longevity: 118 mo
Battery Voltage: 3.03 V
Brady Statistic AP VP Percent: 98.86 %
Brady Statistic AP VS Percent: 0.04 %
Brady Statistic AS VP Percent: 1.09 %
Brady Statistic AS VS Percent: 0.01 %
Brady Statistic RA Percent Paced: 98.9 %
Brady Statistic RV Percent Paced: 99.95 %
Date Time Interrogation Session: 20241127005556
Implantable Lead Connection Status: 753985
Implantable Lead Connection Status: 753985
Implantable Lead Implant Date: 20230829
Implantable Lead Implant Date: 20230829
Implantable Lead Location: 753859
Implantable Lead Location: 753860
Implantable Lead Model: 3830
Implantable Lead Model: 5076
Implantable Pulse Generator Implant Date: 20230829
Lead Channel Impedance Value: 304 Ohm
Lead Channel Impedance Value: 342 Ohm
Lead Channel Impedance Value: 456 Ohm
Lead Channel Impedance Value: 665 Ohm
Lead Channel Pacing Threshold Amplitude: 0.5 V
Lead Channel Pacing Threshold Amplitude: 1.125 V
Lead Channel Pacing Threshold Pulse Width: 0.4 ms
Lead Channel Pacing Threshold Pulse Width: 0.4 ms
Lead Channel Sensing Intrinsic Amplitude: 3.375 mV
Lead Channel Sensing Intrinsic Amplitude: 3.375 mV
Lead Channel Sensing Intrinsic Amplitude: 31.625 mV
Lead Channel Setting Pacing Amplitude: 1.5 V
Lead Channel Setting Pacing Amplitude: 2.25 V
Lead Channel Setting Pacing Pulse Width: 0.4 ms
Lead Channel Setting Sensing Sensitivity: 0.9 mV
Zone Setting Status: 755011
Zone Setting Status: 755011

## 2023-05-14 NOTE — Progress Notes (Signed)
Remote pacemaker transmission.   

## 2023-07-10 ENCOUNTER — Ambulatory Visit (INDEPENDENT_AMBULATORY_CARE_PROVIDER_SITE_OTHER): Payer: Medicare HMO

## 2023-07-10 DIAGNOSIS — R001 Bradycardia, unspecified: Secondary | ICD-10-CM | POA: Diagnosis not present

## 2023-07-11 LAB — CUP PACEART REMOTE DEVICE CHECK
Battery Remaining Longevity: 114 mo
Battery Voltage: 3.02 V
Brady Statistic AP VP Percent: 98.68 %
Brady Statistic AP VS Percent: 0.06 %
Brady Statistic AS VP Percent: 1.25 %
Brady Statistic AS VS Percent: 0.01 %
Brady Statistic RA Percent Paced: 98.74 %
Brady Statistic RV Percent Paced: 99.93 %
Date Time Interrogation Session: 20250226034911
Implantable Lead Connection Status: 753985
Implantable Lead Connection Status: 753985
Implantable Lead Implant Date: 20230829
Implantable Lead Implant Date: 20230829
Implantable Lead Location: 753859
Implantable Lead Location: 753860
Implantable Lead Model: 3830
Implantable Lead Model: 5076
Implantable Pulse Generator Implant Date: 20230829
Lead Channel Impedance Value: 285 Ohm
Lead Channel Impedance Value: 342 Ohm
Lead Channel Impedance Value: 418 Ohm
Lead Channel Impedance Value: 627 Ohm
Lead Channel Pacing Threshold Amplitude: 0.625 V
Lead Channel Pacing Threshold Amplitude: 1.125 V
Lead Channel Pacing Threshold Pulse Width: 0.4 ms
Lead Channel Pacing Threshold Pulse Width: 0.4 ms
Lead Channel Sensing Intrinsic Amplitude: 2.875 mV
Lead Channel Sensing Intrinsic Amplitude: 2.875 mV
Lead Channel Sensing Intrinsic Amplitude: 31.625 mV
Lead Channel Setting Pacing Amplitude: 1.5 V
Lead Channel Setting Pacing Amplitude: 2.25 V
Lead Channel Setting Pacing Pulse Width: 0.4 ms
Lead Channel Setting Sensing Sensitivity: 0.9 mV
Zone Setting Status: 755011
Zone Setting Status: 755011

## 2023-07-16 ENCOUNTER — Encounter: Payer: Self-pay | Admitting: Cardiology

## 2023-08-13 NOTE — Progress Notes (Signed)
 Remote pacemaker transmission.

## 2023-08-13 NOTE — Progress Notes (Signed)
 08/14/2023 10:23 PM   Shane Vazquez 1929-09-26 [id number]  Referring provider: No referring provider defined for this encounter.  Urological history: 1. Urinary retention -during admission for weakness and bradycardia (2023)   2. Prostate cancer -PSA (2018) 6.54 -Treated in [place] with radiation several years ago  Chief Complaint  Patient presents with   Follow-up   HPI: Shane Vazquez is a 89 y.o. male who presents today for urinary incontinence and unable to get to the bathroom on time and interpreter Marchelle Folks and his daughter, Lafonda Mosses.    Previous records reviewed.   He is having three months of urgency and urge incontinence.  Patient denies any modifying or aggravating factors.  Patient denies any recent UTI's, gross hematuria, dysuria or suprapubic/flank pain.  Patient denies any fevers, chills, nausea or vomiting.    I PSS 5/0  PVR 0 mL   UA yellow clear, specific gravity 1.015, trace heme, pH 7.0, 0-5 WBC's, 3-10 RBC's and 0-10 epithelial cells.     IPSS     Row Name 08/14/23 1500         International Prostate Symptom Score   How often have you had the sensation of not emptying your bladder? Not at All     How often have you had to urinate less than every two hours? Less than half the time     How often have you found you stopped and started again several times when you urinated? Not at All     How often have you found it difficult to postpone urination? Less than 1 in 5 times     How often have you had a weak urinary stream? Not at All     How often have you had to strain to start urination? Not at All     How many times did you typically get up at night to urinate? 2 Times     Total IPSS Score 5       Quality of Life due to urinary symptoms   If you were to spend the rest of your life with your urinary condition just the way it is now how would you feel about that? Delighted              Score:  1-7 Mild 8-19 Moderate 20-35 Severe     PMH: Past  Medical History:  Diagnosis Date   Hypertension    Psoriasis    Shingles    Vertigo    Wears dentures    full upper and lower    Surgical History: Past Surgical History:  Procedure Laterality Date   ESOPHAGOGASTRODUODENOSCOPY     many yrs ago   PACEMAKER IMPLANT N/A 01/09/2022   Procedure: PACEMAKER IMPLANT;  Surgeon: Lanier Prude, MD;  [facility];  Service: Cardiovascular;  Laterality: N/A;    Home Medications:  Allergies as of 08/14/2023   No Known Allergies      Medication List        Accurate as of August 14, 2023 11:59 PM. If you have any questions, ask your nurse or doctor.          atorvastatin 20 MG tablet Commonly known as: LIPITOR TAKE 1 TABLET BY MOUTH EVERYDAY AT BEDTIME   bisacodyl 5 MG EC tablet Commonly known as: DULCOLAX Take 5 mg by mouth daily as needed for moderate constipation.   cefUROXime 250 MG tablet Commonly known as: CEFTIN Take 1 tablet (250 mg total) by mouth 2 (two)  times daily with a meal for 7 days.   Coal Tar Extract 2 % Oint Apply 1 Application topically daily as needed (psoriasis).   gabapentin 300 MG capsule Commonly known as: NEURONTIN Take 1 capsule (300 mg total) by mouth 2 (two) times daily.   losartan 50 MG tablet Commonly known as: COZAAR Take 50 mg by mouth daily.   Pazeo 0.7 % Soln Generic drug: Olopatadine HCl Place 1 drop into both eyes daily as needed (for allergies).        Allergies: No Known Allergies  Family History: Family History  Problem Relation Age of Onset   Stomach cancer Mother    Heart disease Father     Social History:  reports that he has never smoked. He has never been exposed to tobacco smoke. He has never used smokeless tobacco. He reports that he does not drink alcohol and does not use drugs.  ROS: Pertinent ROS in HPI  Physical Exam: BP (!) 146/56   Constitutional:  Well nourished. Alert and oriented, No acute distress. HEENT: [place] AT, moist mucus  membranes.  Trachea midline Cardiovascular: No clubbing, cyanosis, or edema. Respiratory: Normal respiratory effort, no increased work of breathing. Neurologic: Grossly intact, no focal deficits, moving all 4 extremities. Psychiatric: Normal mood and affect.  Laboratory Data: Urinalysis See EPIC and HPI  I have reviewed the labs.   Pertinent Imaging:  08/14/23 15:45  Scan Result 0 ml    Assessment & Plan:    1. BPH with LUTS -UA benign w/ micro heme -PVR < 300 cc  -most bothersome symptoms are urgency and urge incontinence -continue conservative management, avoiding bladder irritants and timed voiding's  2. Prostate cancer -last PSA in 2018 -will repeat it once we have rule out infection -urine culture pending  3. Microscopic hematuria -explained that micro  heme  may be the result of infection, stone, BPH, radiation or cancer -urine is sent for culture -he  is   started on Ceftin,  -if  urine culture  is negative, will need to pursue a hematuria work up     Return for pending  urine culture results.  These notes generated with voice recognition software. I apologize for typographical errors.  Cloretta Ned  [facility] [address]  [address] [place], [place] [address] [phone]

## 2023-08-13 NOTE — Addendum Note (Signed)
 Addended by: Elease Etienne A on: 08/13/2023 01:02 PM   Modules accepted: Orders

## 2023-08-14 ENCOUNTER — Ambulatory Visit: Admitting: Urology

## 2023-08-14 VITALS — BP 146/56

## 2023-08-14 DIAGNOSIS — R3129 Other microscopic hematuria: Secondary | ICD-10-CM | POA: Diagnosis not present

## 2023-08-14 DIAGNOSIS — R339 Retention of urine, unspecified: Secondary | ICD-10-CM

## 2023-08-14 DIAGNOSIS — C61 Malignant neoplasm of prostate: Secondary | ICD-10-CM | POA: Diagnosis not present

## 2023-08-14 LAB — URINALYSIS, COMPLETE
Bilirubin, UA: NEGATIVE
Glucose, UA: NEGATIVE
Ketones, UA: NEGATIVE
Leukocytes,UA: NEGATIVE
Nitrite, UA: NEGATIVE
Protein,UA: NEGATIVE
Specific Gravity, UA: 1.015 (ref 1.005–1.030)
Urobilinogen, Ur: 1 mg/dL (ref 0.2–1.0)
pH, UA: 7 (ref 5.0–7.5)

## 2023-08-14 LAB — BLADDER SCAN AMB NON-IMAGING: Scan Result: 0

## 2023-08-14 LAB — MICROSCOPIC EXAMINATION: Bacteria, UA: NONE SEEN

## 2023-08-14 MED ORDER — CEFUROXIME AXETIL 250 MG PO TABS: 250.0000 mg | ORAL_TABLET | Freq: Two times a day (BID) | ORAL | 0 refills | Status: AC

## 2023-08-17 ENCOUNTER — Encounter: Payer: Self-pay | Admitting: Urology

## 2023-08-18 ENCOUNTER — Other Ambulatory Visit: Payer: Self-pay | Admitting: Urology

## 2023-08-18 DIAGNOSIS — R3129 Other microscopic hematuria: Secondary | ICD-10-CM

## 2023-08-18 LAB — CULTURE, URINE COMPREHENSIVE

## 2023-08-19 ENCOUNTER — Ambulatory Visit: Payer: Self-pay | Admitting: General Practice

## 2023-08-19 NOTE — Telephone Encounter (Signed)
 Chief Complaint: Weakness in legs, occurs morning or late afternoon, x1 month  Symptoms: Feels like may fall, dizziness Pertinent Negatives: Patient denies difficulty breathing, nausea, vomiting  Disposition: /[x] Urgent Care (no appt availability in office)   Additional Notes: Spoke with Neida, pt's granddaughter. Pt advised to go to urgent care today and pt granddaughter is agreeable to plan to take him.    Copied from CRM [id number]. Topic: Clinical - Red Word Triage >> Aug 19, 2023  3:21 PM Hamdi H wrote: Red Word that prompted transfer to Nurse Triage: Weakness in his legs and dizziness. Reason for Disposition  [1] MODERATE weakness (i.e., interferes with work, school, normal activities) AND [2] cause unknown  (Exceptions: Weakness from acute minor illness or poor fluid intake; weakness is chronic and not worse.)  Answer Assessment - Initial Assessment Questions SEVERITY: "How bad is it?"  "Can you stand and walk?"   - MILD (0-3): Feels weak or tired, but does not interfere with work, school or normal activities.   - MODERATE (4-7): Able to stand and walk; weakness interferes with work, school, or normal activities.   - SEVERE (8-10): Unable to stand or walk; unable to do usual activities.     Mild to moderate ONSET: "When did these symptoms begin?" (e.g., hours, days, weeks, months)     One month ago CAUSE: "What do you think is causing the weakness or fatigue?" (e.g., not drinking enough fluids, medical problem, trouble sleeping)     Drinking and eating okay per granddaughter; not sure cause, pt has a    pacemaker per granddaughter  Protocols used: Weakness (Generalized) and Fatigue-A-AH

## 2023-08-26 ENCOUNTER — Encounter: Payer: Self-pay | Admitting: Student

## 2023-08-26 ENCOUNTER — Ambulatory Visit (INDEPENDENT_AMBULATORY_CARE_PROVIDER_SITE_OTHER): Admitting: Student

## 2023-08-26 VITALS — BP 122/62 | HR 74 | Ht 65.0 in | Wt 157.4 lb

## 2023-08-26 DIAGNOSIS — L409 Psoriasis, unspecified: Secondary | ICD-10-CM | POA: Insufficient documentation

## 2023-08-26 DIAGNOSIS — R29898 Other symptoms and signs involving the musculoskeletal system: Secondary | ICD-10-CM | POA: Diagnosis not present

## 2023-08-26 DIAGNOSIS — E785 Hyperlipidemia, unspecified: Secondary | ICD-10-CM | POA: Insufficient documentation

## 2023-08-26 DIAGNOSIS — I10 Essential (primary) hypertension: Secondary | ICD-10-CM | POA: Diagnosis not present

## 2023-08-26 DIAGNOSIS — R001 Bradycardia, unspecified: Secondary | ICD-10-CM

## 2023-08-26 DIAGNOSIS — Z8546 Personal history of malignant neoplasm of prostate: Secondary | ICD-10-CM

## 2023-08-26 DIAGNOSIS — G629 Polyneuropathy, unspecified: Secondary | ICD-10-CM | POA: Insufficient documentation

## 2023-08-26 DIAGNOSIS — D61818 Other pancytopenia: Secondary | ICD-10-CM

## 2023-08-26 MED ORDER — GABAPENTIN 300 MG PO CAPS: 300.0000 mg | ORAL_CAPSULE | Freq: Two times a day (BID) | ORAL | 3 refills | Status: AC

## 2023-08-26 MED ORDER — TRIAMCINOLONE ACETONIDE 0.025 % EX OINT: 1.0000 | TOPICAL_OINTMENT | Freq: Two times a day (BID) | CUTANEOUS | 1 refills | Status: DC

## 2023-08-26 MED ORDER — LOSARTAN POTASSIUM 50 MG PO TABS: 50.0000 mg | ORAL_TABLET | Freq: Every day | ORAL | 3 refills | Status: AC

## 2023-08-26 NOTE — Assessment & Plan Note (Signed)
 S/p pacemaker placement 01/09/2022, reports he has not seen cardiology in [place]  since this was placed. He is now living in [place] with daughter. Follow up with cardiology

## 2023-08-26 NOTE — Assessment & Plan Note (Signed)
 Taking gabapentin 300 mg twice daily for neuropathy of the feet. Daughter thinks this may be related to prediabetes. Check A1c and B12. Is having some dizziness with standing will have him hold morning dose to see if this improves.

## 2023-08-26 NOTE — Patient Instructions (Addendum)
 Follow up with Cardiology at Address: [address], [place], [place] [address] Phone: [phone]   Try taking gabapentin only at nighttime to see if this helps the dizziness

## 2023-08-26 NOTE — Assessment & Plan Note (Signed)
 Psoriatic lesion of the right trunk, uses over the counter lotions. Has not followed with dermatology as he does not want to take more medications. Start medium potency triamcinolone for this.

## 2023-08-26 NOTE — Assessment & Plan Note (Addendum)
 Appears he was on atorvastatin 20 mg daily in the past but he has not been taking this. Denies side effects with this. Also with history of aortic atherosclerosis and superior mesenteric artery stenosis. Lipid panel today.

## 2023-08-26 NOTE — Assessment & Plan Note (Signed)
 BP well controlled today. Continue losartan 50 mg daily.

## 2023-08-26 NOTE — Assessment & Plan Note (Signed)
 Radiation treatment in [place]  3 years ago per daughter.Currently  seeing with microscopic hematuria, has CT hematuria scan tomorrow.

## 2023-08-26 NOTE — Assessment & Plan Note (Signed)
 This has been a chronic issue that seem to be worsening. Seems to be worsening with exertion. Right leg is more bothersome and had diminished pulses of the right foot. Referral to vascular surgery for further evaluation.

## 2023-08-26 NOTE — Assessment & Plan Note (Signed)
 Pancytopenia from multiple labs in July-August 2023. He does not remember prior labs. CBC today.

## 2023-08-26 NOTE — Progress Notes (Addendum)
 New Patient Office Visit  Subjective    Patient ID: Shane Vazquez, male    DOB: [birth year]  Age: 89 y.o. MRN: [id number]  CC:  Chief Complaint  Patient presents with   Establish Care    Moved from [place], establish care in [place]   Dizziness   Extremity Weakness    Patient said he is experiencing leg/ foot weakness   HPI Shane Vazquez is an 89 year old person living with sinus bradycardia s/p pacemaker in 2023, HTN, HLD, prostate cancer s/p radiation, prediabetes who presents to establish care moved to [place] from [place]  to stay with daughter.   Leg pain Patient reports heaviness and discomfort in the legs when walking. This has been ongoing for last 3-4 year. He has pain when laying and sitting but worsens with activity. R leg is more bothersome with numbness in the thigh radiating to the lower leg. Has some pins and needles of the bottom of the feet for which he takes gabapentin, but no in the legs. He denies fever, chills, back pain, leg swelling, hip pain, or knee pain. Denies recent fall, no trauma to the leg. He does not use mobility aids. He reports being active and is independent of ADLs.  Outpatient Encounter Medications as of 08/26/2023  Medication Sig   atorvastatin (LIPITOR) 20 MG tablet TAKE 1 TABLET BY MOUTH EVERYDAY AT BEDTIME   bisacodyl (DULCOLAX) 5 MG EC tablet Take 5 mg by mouth daily as needed for moderate constipation.   Coal Tar Extract 2 % OINT Apply 1 Application topically daily as needed (psoriasis).   Olopatadine HCl (PAZEO) 0.7 % SOLN Place 1 drop into both eyes daily as needed (for allergies).   triamcinolone (KENALOG) 0.025 % ointment Apply 1 Application topically 2 (two) times daily.   [DISCONTINUED] gabapentin (NEURONTIN) 300 MG capsule Take 1 capsule (300 mg total) by mouth 2 (two) times daily.   [DISCONTINUED] losartan (COZAAR) 50 MG tablet Take 50 mg by mouth daily.   gabapentin (NEURONTIN) 300 MG capsule Take 1 capsule (300 mg total) by mouth 2 (two) times daily.    losartan (COZAAR) 50 MG tablet Take 1 tablet (50 mg total) by mouth daily.   No facility-administered encounter medications on file as of 08/26/2023.    Past Medical History:  Diagnosis Date   Hypertension    Psoriasis    Shingles    Vertigo    Wears dentures    full upper and lower    Past Surgical History:  Procedure Laterality Date   ESOPHAGOGASTRODUODENOSCOPY     many yrs ago   PACEMAKER IMPLANT N/A 01/09/2022   Procedure: PACEMAKER IMPLANT;  Surgeon: Boyce Byes, MD;  [facility];  Service: Cardiovascular;  Laterality: N/A;    Family History  Problem Relation Age of Onset   Stomach cancer Mother    Heart disease Father     Social History   Socioeconomic History   Marital status: Widowed    Spouse name: Not on file   Number of children: Not on file   Years of education: Not on file   Highest education level: Not on file  Occupational History   Not on file  Tobacco Use   Smoking status: Never    Passive exposure: Never   Smokeless tobacco: Never  Vaping Use   Vaping status: Never Used  Substance and Sexual Activity   Alcohol use: No   Drug use: No   Sexual activity: Not Currently  Other  Topics Concern   Not on file  Social History Narrative   Not on file   Social Drivers of Health   Financial Resource Strain: Not on file  Food Insecurity: Not on file  Transportation Needs: Not on file  Physical Activity: Sufficiently Active (12/11/2017)   [facility]   Exercise Vital Sign    Days of Exercise per Week: 4 days    Minutes of Exercise per Session: 60 min  Stress: Not on file  Social Connections: Not on file  Intimate Partner Violence: Not on file    ROS Refer to HPI    Objective   BP 122/62   Pulse 74   Ht 5\' 5"  (1.651 m)   Wt 157 lb 6 oz (71.4 kg)   SpO2 98%   BMI 26.19 kg/m   Physical Exam Constitutional:      Appearance: Normal appearance.  HENT:      Mouth/Throat:     Mouth: Mucous membranes are moist.     Pharynx: Oropharynx is clear.  Eyes:     Extraocular Movements: Extraocular movements intact.     Conjunctiva/sclera: Conjunctivae normal.     Pupils: Pupils are equal, round, and reactive to light.  Cardiovascular:     Rate and Rhythm: Normal rate and regular rhythm.     Heart sounds: No murmur heard. Pulmonary:     Effort: Pulmonary effort is normal.     Breath sounds: No rhonchi or rales.  Abdominal:     General: Abdomen is flat. Bowel sounds are normal. There is no distension.     Palpations: Abdomen is soft.     Tenderness: There is no abdominal tenderness.  Musculoskeletal:        General: Normal range of motion.     Right lower leg: No edema.     Left lower leg: No edema.     Comments: Diminished DP pulse of the R foot, 2+ on the left  Skin:    General: Skin is dry.     Capillary Refill: Capillary refill takes less than 2 seconds.     Comments: 4 cm area of psoriatic places of the right posterior trunk   Neurological:     General: No focal deficit present.     Mental Status: He is alert and oriented to person, place, and time.     Comments: Normal gait, normal strength with hip and knee flexion and extension, normal foot strength  Psychiatric:        Mood and Affect: Mood normal.        Behavior: Behavior normal.        08/26/2023    9:39 AM  Depression screen PHQ 2/9  Decreased Interest 1  Down, Depressed, Hopeless 1  PHQ - 2 Score 2  Altered sleeping 3  Tired, decreased energy 2  Change in appetite 2  Feeling bad or failure about yourself  1  Trouble concentrating 1  Moving slowly or fidgety/restless 1  Suicidal thoughts 0  PHQ-9 Score 12  Difficult doing work/chores Somewhat difficult      08/26/2023    9:39 AM  GAD 7 : Generalized Anxiety Score  Nervous, Anxious, on Edge 1  Control/stop worrying 1  Worry too much - different things 1  Trouble relaxing 2  Restless 1  Easily annoyed or  irritable 0  Afraid - awful might happen 0  Total GAD 7 Score 6    Last CBC  Lab Results  Component Value Date   WBC 3.6 (L) 12/20/2021   HGB 11.3 (L) 12/20/2021   HCT 33.6 (L) 12/20/2021   MCV 93.9 12/20/2021   MCH 31.6 12/20/2021   RDW 12.8 12/20/2021   PLT 154 12/20/2021   Last metabolic panel Lab Results  Component Value Date   GLUCOSE 99 01/09/2022   NA 138 01/09/2022   K 4.2 01/09/2022   CL 105 01/09/2022   CO2 28 01/09/2022   BUN 11 01/09/2022   CREATININE 0.98 01/09/2022   GFRNONAA >60 01/09/2022   CALCIUM 9.1 01/09/2022   PROT 6.9 11/30/2021   ALBUMIN 4.0 11/30/2021   BILITOT 0.9 11/30/2021   ALKPHOS 74 11/30/2021   AST 25 11/30/2021   ALT 16 11/30/2021   ANIONGAP 5 01/09/2022        Assessment & Plan:  Leg heaviness Assessment & Plan: This has been a chronic issue that seem to be worsening. Seems to be worsening with exertion. Right leg is more bothersome and had diminished pulses of the right foot. Referral to vascular surgery for further evaluation.   Orders: -     Ambulatory referral to Vascular Surgery  Neuropathy Assessment & Plan: Taking gabapentin 300 mg twice daily for neuropathy of the feet. Daughter thinks this may be related to prediabetes. Check A1c and B12. Is having some dizziness with standing will have him hold morning dose to see if this improves.   Orders: -     Vitamin B12 -     Hemoglobin A1c  Essential hypertension Assessment & Plan: BP well controlled today. Continue losartan 50 mg daily.   Orders: -     Comprehensive metabolic panel with GFR  Hyperlipidemia, unspecified hyperlipidemia type Assessment & Plan: Appears he was on atorvastatin 20 mg daily in the past but he has not been taking this. Denies side effects with this. Also with history of aortic atherosclerosis and superior mesenteric artery stenosis. Lipid panel today.   Orders: -     Lipid panel  Psoriasis Assessment & Plan: Psoriatic lesion of the right  trunk, uses over the counter lotions. Has not followed with dermatology as he does not want to take more medications. Start medium potency triamcinolone for this.   Orders: -     Triamcinolone Acetonide; Apply 1 Application topically 2 (two) times daily.  Dispense: 30 g; Refill: 1  Pancytopenia (HCC) Assessment & Plan: Pancytopenia from multiple labs in July-August 2023. He does not remember prior labs. CBC today.   Orders: -     CBC with Differential/Platelet  History of prostate cancer Assessment & Plan: Radiation treatment in [place] 3 years ago per daughter.Currently  seeing with microscopic hematuria, has CT hematuria scan tomorrow.    Sinus bradycardia Assessment & Plan: S/p pacemaker placement 01/09/2022, reports he has not seen cardiology in [place] since this was placed. He is now living in [place] with daughter. Follow up with cardiology   Other orders -     Losartan Potassium; Take 1 tablet (50 mg total) by mouth daily.  Dispense: 90 tablet; Refill: 3 -     Gabapentin; Take 1 capsule (300 mg total) by mouth 2 (two) times daily.  Dispense: 180 capsule; Refill: 3    Return in about 3 months (around 11/25/2023) for physical.   Quincy Simmonds, MD

## 2023-08-27 ENCOUNTER — Ambulatory Visit

## 2023-08-27 LAB — HEMOGLOBIN A1C
Est. average glucose Bld gHb Est-mCnc: 120 mg/dL
Hgb A1c MFr Bld: 5.8 % — ABNORMAL HIGH (ref 4.8–5.6)

## 2023-08-27 LAB — CBC WITH DIFFERENTIAL/PLATELET
Basophils Absolute: 0 10*3/uL (ref 0.0–0.2)
Basos: 1 %
EOS (ABSOLUTE): 0.1 10*3/uL (ref 0.0–0.4)
Eos: 3 %
Hematocrit: 38.3 % (ref 37.5–51.0)
Hemoglobin: 12.9 g/dL — ABNORMAL LOW (ref 13.0–17.7)
Immature Grans (Abs): 0 10*3/uL (ref 0.0–0.1)
Immature Granulocytes: 0 %
Lymphocytes Absolute: 1.4 10*3/uL (ref 0.7–3.1)
Lymphs: 36 %
MCH: 31.4 pg (ref 26.6–33.0)
MCHC: 33.7 g/dL (ref 31.5–35.7)
MCV: 93 fL (ref 79–97)
Monocytes Absolute: 0.4 10*3/uL (ref 0.1–0.9)
Monocytes: 10 %
Neutrophils Absolute: 1.9 10*3/uL (ref 1.4–7.0)
Neutrophils: 50 %
Platelets: 149 10*3/uL — ABNORMAL LOW (ref 150–450)
RBC: 4.11 x10E6/uL — ABNORMAL LOW (ref 4.14–5.80)
RDW: 12.5 % (ref 11.6–15.4)
WBC: 3.8 10*3/uL (ref 3.4–10.8)

## 2023-08-27 LAB — COMPREHENSIVE METABOLIC PANEL WITH GFR
ALT: 13 IU/L (ref 0–44)
AST: 23 IU/L (ref 0–40)
Albumin: 4.1 g/dL (ref 3.6–4.6)
Alkaline Phosphatase: 98 IU/L (ref 44–121)
BUN/Creatinine Ratio: 14 (ref 10–24)
BUN: 13 mg/dL (ref 10–36)
Bilirubin Total: 0.4 mg/dL (ref 0.0–1.2)
CO2: 24 mmol/L (ref 20–29)
Calcium: 9.3 mg/dL (ref 8.6–10.2)
Chloride: 100 mmol/L (ref 96–106)
Creatinine, Ser: 0.95 mg/dL (ref 0.76–1.27)
Globulin, Total: 2.8 g/dL (ref 1.5–4.5)
Glucose: 97 mg/dL (ref 70–99)
Potassium: 4.6 mmol/L (ref 3.5–5.2)
Sodium: 138 mmol/L (ref 134–144)
Total Protein: 6.9 g/dL (ref 6.0–8.5)
eGFR: 76 mL/min/{1.73_m2} (ref >59)

## 2023-08-27 LAB — VITAMIN B12: Vitamin B-12: >2000 pg/mL — ABNORMAL HIGH (ref 232–1245)

## 2023-08-27 LAB — LIPID PANEL
Chol/HDL Ratio: 3.1 ratio (ref 0.0–5.0)
Cholesterol, Total: 177 mg/dL (ref 100–199)
HDL: 58 mg/dL (ref >39)
LDL Chol Calc (NIH): 103 mg/dL — ABNORMAL HIGH (ref 0–99)
Triglycerides: 86 mg/dL (ref 0–149)
VLDL Cholesterol Cal: 16 mg/dL (ref 5–40)

## 2023-08-29 ENCOUNTER — Ambulatory Visit: Admitting: Student

## 2023-08-29 NOTE — Progress Notes (Signed)
 Spoke with patient's daughter, Shane Vazquez regarding results. She is ware of results and verbalized understanding. She stated that she did view the results on MyChart as well.

## 2023-09-07 ENCOUNTER — Emergency Department

## 2023-09-07 ENCOUNTER — Other Ambulatory Visit: Payer: Self-pay

## 2023-09-07 ENCOUNTER — Emergency Department
Admission: EM | Admit: 2023-09-07 | Discharge: 2023-09-07 | Disposition: A | Discharge status: Home / Self Care | Attending: Emergency Medicine | Admitting: Emergency Medicine

## 2023-09-07 DIAGNOSIS — R42 Dizziness and giddiness: Secondary | ICD-10-CM | POA: Insufficient documentation

## 2023-09-07 DIAGNOSIS — R519 Headache, unspecified: Secondary | ICD-10-CM | POA: Insufficient documentation

## 2023-09-07 DIAGNOSIS — Z950 Presence of cardiac pacemaker: Secondary | ICD-10-CM | POA: Diagnosis not present

## 2023-09-07 DIAGNOSIS — R7989 Other specified abnormal findings of blood chemistry: Secondary | ICD-10-CM | POA: Diagnosis not present

## 2023-09-07 HISTORY — DX: Disorder of kidney and ureter, unspecified: N28.9

## 2023-09-07 LAB — BASIC METABOLIC PANEL WITH GFR
Anion gap: 6 (ref 5–15)
BUN: 19 mg/dL (ref 8–23)
CO2: 26 mmol/L (ref 22–32)
Calcium: 9 mg/dL (ref 8.9–10.3)
Chloride: 102 mmol/L (ref 98–111)
Creatinine, Ser: 0.87 mg/dL (ref 0.61–1.24)
GFR, Estimated: >60 mL/min (ref >60)
Glucose, Bld: 110 mg/dL — ABNORMAL HIGH (ref 70–99)
Potassium: 4.5 mmol/L (ref 3.5–5.1)
Sodium: 134 mmol/L — ABNORMAL LOW (ref 135–145)

## 2023-09-07 LAB — CBC
HCT: 36.7 % — ABNORMAL LOW (ref 39.0–52.0)
Hemoglobin: 12.3 g/dL — ABNORMAL LOW (ref 13.0–17.0)
MCH: 31.9 pg (ref 26.0–34.0)
MCHC: 33.5 g/dL (ref 30.0–36.0)
MCV: 95.1 fL (ref 80.0–100.0)
Platelets: 131 10*3/uL — ABNORMAL LOW (ref 150–400)
RBC: 3.86 MIL/uL — ABNORMAL LOW (ref 4.22–5.81)
RDW: 12.5 % (ref 11.5–15.5)
WBC: 4.8 10*3/uL (ref 4.0–10.5)
nRBC: 0 % (ref 0.0–0.2)

## 2023-09-07 LAB — HEPATIC FUNCTION PANEL
ALT: 15 U/L (ref 0–44)
AST: 23 U/L (ref 15–41)
Albumin: 3.6 g/dL (ref 3.5–5.0)
Alkaline Phosphatase: 62 U/L (ref 38–126)
Bilirubin, Direct: <0.1 mg/dL (ref 0.0–0.2)
Total Bilirubin: 0.7 mg/dL (ref 0.0–1.2)
Total Protein: 6.6 g/dL (ref 6.5–8.1)

## 2023-09-07 LAB — URINALYSIS, ROUTINE W REFLEX MICROSCOPIC
Bilirubin Urine: NEGATIVE
Glucose, UA: NEGATIVE mg/dL
Hgb urine dipstick: NEGATIVE
Ketones, ur: NEGATIVE mg/dL
Leukocytes,Ua: NEGATIVE
Nitrite: NEGATIVE
Protein, ur: NEGATIVE mg/dL
Specific Gravity, Urine: 1.009 (ref 1.005–1.030)
pH: 7 (ref 5.0–8.0)

## 2023-09-07 LAB — RESP PANEL BY RT-PCR (RSV, FLU A&B, COVID)  RVPGX2
Influenza A by PCR: NEGATIVE
Influenza B by PCR: NEGATIVE
Resp Syncytial Virus by PCR: NEGATIVE
SARS Coronavirus 2 by RT PCR: NEGATIVE

## 2023-09-07 LAB — TROPONIN I (HIGH SENSITIVITY)
Troponin I (High Sensitivity): 76 ng/L — ABNORMAL HIGH (ref <18)
Troponin I (High Sensitivity): 70 ng/L — ABNORMAL HIGH (ref <18)

## 2023-09-07 MED ORDER — ACETAMINOPHEN 500 MG PO TABS
1000.0000 mg | ORAL_TABLET | Freq: Once | ORAL | Status: AC
  Administered 2023-09-07: 1000 mg via ORAL
  Filled 2023-09-07: qty 2

## 2023-09-07 MED ORDER — MECLIZINE HCL 25 MG PO TABS
25.0000 mg | ORAL_TABLET | Freq: Once | ORAL | Status: AC
  Administered 2023-09-07: 25 mg via ORAL
  Filled 2023-09-07: qty 1

## 2023-09-07 MED ORDER — SODIUM CHLORIDE 0.9 % IV BOLUS
500.0000 mL | Freq: Once | INTRAVENOUS | Status: AC
  Administered 2023-09-07: 500 mL via INTRAVENOUS

## 2023-09-07 NOTE — ED Notes (Signed)
 Delay in d/c d/t multiple complex d/c's with language barriers needing [facility] interpreter.

## 2023-09-07 NOTE — ED Provider Notes (Signed)
 [facility] Provider Note    Event Date/Time   First MD Initiated Contact with Patient 09/07/23 1513     (approximate)   History   Dizziness and Headache   HPI  Shane Vazquez is a 89 y.o. male who comes in with concerns for headaches, dizziness.  Patient comes in with a daughter.  Previously lived in [place]  but now cannot live by himself but secondary to some issues with leaving the stove on and memory issues.  Daughter is at bedside.  Patient had intermittent issues with dizziness for some time now.  However today he did report that it was a little bit worse.  He did feel like he was going to pass out but never actually lost consciousness.  Has not fallen in over a year although in the triage note I did report that patient has had some increasing falls and actually has not been sick for some time now.  Patient denies any chest pain, shortness of breath, abdominal pain, urinary symptoms.  They report that he does take gabapentin  in the morning and that his primary care doctor thought that that some of the dizziness could be related to the gabapentin .  He denies any obvious urinary symptoms.  He states that he is feeling better now after some time.  Did report a headache in the back of his head that was around 9-10 AM.  Was not sudden or severe in intensity.  Reports that it was gradually worsening.  Reports headache is minimal at this time.  There is also reports some blurred vision but that is also resolved and he just does not have his glasses here today.   Physical Exam   Triage Vital Signs: ED Triage Vitals  Encounter Vitals Group     BP 09/07/23 1311 (!) 151/62     Systolic BP Percentile --      Diastolic BP Percentile --      Pulse Rate 09/07/23 1311 70     Resp 09/07/23 1311 20     Temp 09/07/23 1311 98 F (36.7 C)     Temp Source 09/07/23 1311 Oral     SpO2 09/07/23 1311 98 %     Weight 09/07/23 1309 158 lb 11.7 oz (72 kg)     Height 09/07/23 1309 5'  6" (1.676 m)     Head Circumference --      Peak Flow --      Pain Score 09/07/23 1309 10     Pain Loc --      Pain Education --      Exclude from Growth Chart --     Most recent vital signs: Vitals:   09/07/23 1504 09/07/23 1505  BP:    Pulse: 70 70  Resp: (!) 21 20  Temp:    SpO2: 100% 100%     General: Awake, no distress.  CV:  Good peripheral perfusion.  Resp:  Normal effort.  Abd:  No distention.  Other:  Clear nerves II through XII are intact.  Equal strength.  Finger-to-nose intact bilaterally heel-to-shin intact bilaterally.  Patient was able to ambulate with the nurse without any ataxia.   ED Results / Procedures / Treatments   Labs (all labs ordered are listed, but only abnormal results are displayed) Labs Reviewed  BASIC METABOLIC PANEL WITH GFR - Abnormal; Notable for the following components:      Result Value   Sodium 134 (*)    Glucose, Bld 110 (*)  All other components within normal limits  CBC - Abnormal; Notable for the following components:   RBC 3.86 (*)    Hemoglobin 12.3 (*)    HCT 36.7 (*)    Platelets 131 (*)    All other components within normal limits  TROPONIN I (HIGH SENSITIVITY) - Abnormal; Notable for the following components:   Troponin I (High Sensitivity) 76 (*)    All other components within normal limits  TROPONIN I (HIGH SENSITIVITY)     EKG  My interpretation of EKG:  AV dual paced at a rate of 71 without any ST elevation or T wave inversions, normal intervals  RADIOLOGY I have reviewed the xray personally and interpreted patient has a pacemaker no evidence of pneumonia   PROCEDURES:  Critical Care performed: No  .1-3 Lead EKG Interpretation  Performed by: Lubertha Rush, MD Authorized by: Lubertha Rush, MD     Interpretation: normal     ECG rate:  70   ECG rate assessment: normal     Rhythm: sinus rhythm     Ectopy: none     Conduction: normal      MEDICATIONS ORDERED IN ED: Medications  meclizine  (ANTIVERT) tablet 25 mg (25 mg Oral Given 09/07/23 1455)  acetaminophen  (TYLENOL ) tablet 1,000 mg (1,000 mg Oral Given 09/07/23 1541)  sodium chloride  0.9 % bolus 500 mL (0 mLs Intravenous Stopped 09/07/23 1554)     IMPRESSION / MDM / ASSESSMENT AND PLAN / ED COURSE  I reviewed the triage vital signs and the nursing notes.   Patient's presentation is most consistent with acute presentation with potential threat to life or bodily function.   Patient comes in with history of dizziness, headache.  CT head ordered within 6 hours of symptom onset and was not sudden or severe in onset to suggest subarachnoid hemorrhage.  Seems less consistent with stroke given multiple episodes of dizziness in the past.  He is got a normal neurological examination.  He is unable to get MRI here secondary to pacemaker.  Will get workup to evaluate for Electra abnormalities, AKI, UTI, COVID, flu, ACS and treat patient with some fluids, Tylenol .  Will interrogate pacemaker. Pt has no blurred vision now no pain on temporal area.  Troponin is slightly elevated but similar to priors we will get a repeat.  BMP is reassuring.  CBC shows stable hemoglobin.  UA is negative for UTI COVID and flu are negative troponins are similar to prior and downtrending patient does have a pacemaker he denies any active chest pain, shortness of breath.  I considered admission for the elevated troponins but it seems less cardiac in nature given his pacemaker was interrogated and that there were no issues on it.  Given patient's age and comorbidities do not feel like he would need a catheterization for these troponins and we discussed return precautions in regards to chest pain.  His sodium was slightly low and patient was given some fluids.   I considered admission given patient's age but on repeat evaluation patient reports resolution of symptoms he reports feeling much better.  We discussed following up with his primary care doctor return to the  ER if he develops worsening dizziness.  We did discuss that if the dizziness was a lot worse that he should go to a hospital that can do MRIs if necessary for his dizziness.  They expressed understanding and felt comfortable with this plan.  They are going to talk to their doctor about his  medications to see if his gabapentin  could be causing some of these dizzy episodes.  At this time they feel comfortable with discharge home and will return if symptoms are worsening or changing  The patient is on the cardiac monitor to evaluate for evidence of arrhythmia and/or significant heart rate changes.      FINAL CLINICAL IMPRESSION(S) / ED DIAGNOSES   Final diagnoses:  Dizziness  Intractable headache, unspecified chronicity pattern, unspecified headache type     Rx / DC Orders   ED Discharge Orders     None        Note:  This document was prepared using Dragon voice recognition software and may include unintentional dictation errors.   Lubertha Rush, MD 09/07/23 [phone]

## 2023-09-07 NOTE — ED Notes (Signed)
Not in room, pt in CT

## 2023-09-07 NOTE — ED Notes (Signed)
 EDP in to see, at [facility]. Speaking with pt and daughter.

## 2023-09-07 NOTE — Discharge Instructions (Addendum)
 Stay well-hydrated with fluids including Gatorade without sugar.  Take Tylenol  1 g every 8 hours to help with headaches as needed for the next 1 week.  Call your primary care doctor to discuss the dizzy episodes.  I placed a referral for cardiology given his heart markers were a little bit elevated but I do not feel like this represents a active heart attack.  If he develops chest pain, worsening dizziness he should return to the ER to have them reevaluated.  His pacemaker appears to be functioning correctly but if he has worsening symptoms then he should return for further workup.

## 2023-09-07 NOTE — ED Notes (Signed)
 Steady on feet, walking at [facility]

## 2023-09-07 NOTE — ED Triage Notes (Signed)
 Pt to ED POV with daughter for ongoing dizziness (since years) but worse since today. Denies unilateral weakness. Also has posterior HA since this AM. Pt endorses blurry vision but states always has this and is bilateral. Pt in NAD. Pt Spanish speaking, daughter also speaks fluent [place]. Pt ambulates. Blue top sent.

## 2023-09-07 NOTE — ED Notes (Signed)
 Daughter reports pt visiting from [place], but will stay here with her. He can no longer stay by himself. Verbalizes, "he falls frequently and leaves the stove on".

## 2023-10-01 ENCOUNTER — Ambulatory Visit

## 2023-10-01 ENCOUNTER — Encounter (INDEPENDENT_AMBULATORY_CARE_PROVIDER_SITE_OTHER): Payer: Self-pay

## 2023-10-09 ENCOUNTER — Ambulatory Visit: Payer: Medicare HMO

## 2023-10-09 DIAGNOSIS — R001 Bradycardia, unspecified: Secondary | ICD-10-CM | POA: Diagnosis not present

## 2023-10-10 LAB — CUP PACEART REMOTE DEVICE CHECK
Battery Remaining Longevity: 110 mo
Battery Voltage: 3.01 V
Brady Statistic AP VP Percent: 99.05 %
Brady Statistic AP VS Percent: 0.05 %
Brady Statistic AS VP Percent: 0.9 %
Brady Statistic AS VS Percent: 0 %
Brady Statistic RA Percent Paced: 99.1 %
Brady Statistic RV Percent Paced: 99.94 %
Date Time Interrogation Session: 20250527205013
Implantable Lead Connection Status: 753985
Implantable Lead Connection Status: 753985
Implantable Lead Implant Date: 20230829
Implantable Lead Implant Date: 20230829
Implantable Lead Location: 753859
Implantable Lead Location: 753860
Implantable Lead Model: 3830
Implantable Lead Model: 5076
Implantable Pulse Generator Implant Date: 20230829
Lead Channel Impedance Value: 285 Ohm
Lead Channel Impedance Value: 342 Ohm
Lead Channel Impedance Value: 418 Ohm
Lead Channel Impedance Value: 608 Ohm
Lead Channel Pacing Threshold Amplitude: 0.5 V
Lead Channel Pacing Threshold Amplitude: 1 V
Lead Channel Pacing Threshold Pulse Width: 0.4 ms
Lead Channel Pacing Threshold Pulse Width: 0.4 ms
Lead Channel Sensing Intrinsic Amplitude: 3.625 mV
Lead Channel Sensing Intrinsic Amplitude: 3.625 mV
Lead Channel Sensing Intrinsic Amplitude: 31.625 mV
Lead Channel Setting Pacing Amplitude: 1.5 V
Lead Channel Setting Pacing Amplitude: 2.25 V
Lead Channel Setting Pacing Pulse Width: 0.4 ms
Lead Channel Setting Sensing Sensitivity: 0.9 mV
Zone Setting Status: 755011
Zone Setting Status: 755011

## 2023-10-14 ENCOUNTER — Ambulatory Visit: Payer: Self-pay | Admitting: Cardiology

## 2023-10-14 NOTE — Progress Notes (Unsigned)
 10/16/2023 9:37 PM   Shane Vazquez 1930/03/06 [id number]  Referring provider: No referring provider defined for this encounter.  Urological history: 1. Urinary retention -during admission for weakness and bradycardia (2023)   2. Prostate cancer -PSA (2018) 6.54 -Treated in [place]  with radiation several years ago  No chief complaint on file.  HPI: Shane Vazquez is a 89 y.o. male who presents today for 2 month follow up with interpreter Mylinda Asa and his daughter, [address] [place].  ***  Previous records reviewed.   At his visit on 08/14/2023, he is having three months of urgency and urge incontinence.  Patient denies any modifying or aggravating factors.  Patient denies any recent UTI's, gross hematuria, dysuria or suprapubic/flank pain.  Patient denies any fevers, chills, nausea or vomiting.   I PSS 5/0.   PVR 0 mL.  UA yellow clear, specific gravity 1.015, trace heme, pH 7.0, 0-5 WBC's, 3-10 RBC's and 0-10 epithelial cells.    Urine culture was negative.   At CT scan was ordered, but it has not been scheduled as of this visit.    UA ***   PMH: Past Medical History:  Diagnosis Date   Hypertension    Psoriasis    Renal disorder    sees urologist for hematuria   Shingles    Vertigo    Wears dentures    full upper and lower    Surgical History: Past Surgical History:  Procedure Laterality Date   ESOPHAGOGASTRODUODENOSCOPY     many yrs ago   PACEMAKER IMPLANT N/A 01/09/2022   Procedure: PACEMAKER IMPLANT;  Surgeon: Boyce Byes, MD;  Location: [facility]IVE CV [facility];  Service: Cardiovascular;  Laterality: N/A;    Home Medications:  Allergies as of 10/16/2023   No Known Allergies      Medication List        Accurate as of October 14, 2023  9:37 PM. If you have any questions, ask your nurse or doctor.          atorvastatin  20 MG tablet Commonly known as: LIPITOR TAKE 1 TABLET BY MOUTH EVERYDAY AT BEDTIME   bisacodyl 5 MG EC tablet Commonly known as: DULCOLAX Take 5 mg  by mouth daily as needed for moderate constipation.   Coal Tar Extract 2 % Oint Apply 1 Application topically daily as needed (psoriasis).   gabapentin  300 MG capsule Commonly known as: NEURONTIN  Take 1 capsule (300 mg total) by mouth 2 (two) times daily.   losartan  50 MG tablet Commonly known as: COZAAR  Take 1 tablet (50 mg total) by mouth daily.   Pazeo 0.7 % Soln Generic drug: Olopatadine HCl Place 1 drop into both eyes daily as needed (for allergies).   triamcinolone  0.025 % ointment Commonly known as: KENALOG  Apply 1 Application topically 2 (two) times daily.        Allergies: No Known Allergies  Family History: Family History  Problem Relation Age of Onset   Stomach cancer Mother    Heart disease Father     Social History:  reports that he has never smoked. He has never been exposed to tobacco smoke. He has never used smokeless tobacco. He reports that he does not drink alcohol and does not use drugs.  ROS: Pertinent ROS in HPI  Physical Exam: There were no vitals taken for this visit.  Constitutional:  Well nourished. Alert and oriented, No acute distress. HEENT: [place] AT, moist mucus membranes.  Trachea midline, no masses. Cardiovascular: No clubbing, cyanosis, or edema. Respiratory: Normal  respiratory effort, no increased work of breathing. GI: Abdomen is soft, non tender, non distended, no abdominal masses. Liver and spleen not palpable.  No hernias appreciated.  Stool sample for occult testing is not indicated.   GU: No CVA tenderness.  No bladder fullness or masses.  Patient with circumcised/uncircumcised phallus. ***Foreskin easily retracted***  Urethral meatus is patent.  No penile discharge. No penile lesions or rashes. Scrotum without lesions, cysts, rashes and/or edema.  Testicles are located scrotally bilaterally. No masses are appreciated in the testicles. Left and right epididymis are normal. Rectal: Patient with  normal sphincter tone. Anus and  perineum without scarring or rashes. No rectal masses are appreciated. Prostate is approximately *** grams, *** nodules are appreciated. Seminal vesicles are normal. Skin: No rashes, bruises or suspicious lesions. Lymph: No cervical or inguinal adenopathy. Neurologic: Grossly intact, no focal deficits, moving all 4 extremities. Psychiatric: Normal mood and affect.   Laboratory Data: CBC    Component Value Date/Time   WBC 4.8 09/07/2023 1314   RBC 3.86 (L) 09/07/2023 1314   HGB 12.3 (L) 09/07/2023 1314   HGB 12.9 (L) 08/26/2023 1110   HCT 36.7 (L) 09/07/2023 1314   HCT 38.3 08/26/2023 1110   PLT 131 (L) 09/07/2023 1314   PLT 149 (L) 08/26/2023 1110   MCV 95.1 09/07/2023 1314   MCV 93 08/26/2023 1110   MCH 31.9 09/07/2023 1314   MCHC 33.5 09/07/2023 1314   RDW 12.5 09/07/2023 1314   RDW 12.5 08/26/2023 1110   LYMPHSABS 1.4 08/26/2023 1110   MONOABS 0.4 12/20/2021 1236   EOSABS 0.1 08/26/2023 1110   BASOSABS 0.0 08/26/2023 1110    BMET    Component Value Date/Time   NA 134 (L) 09/07/2023 1314   NA 138 08/26/2023 1110   K 4.5 09/07/2023 1314   CL 102 09/07/2023 1314   CO2 26 09/07/2023 1314   GLUCOSE 110 (H) 09/07/2023 1314   BUN 19 09/07/2023 1314   BUN 13 08/26/2023 1110   CREATININE 0.87 09/07/2023 1314   CALCIUM  9.0 09/07/2023 1314   EGFR 76 08/26/2023 1110   GFRNONAA >60 09/07/2023 1314    Hemoglobin A1c Order: [id number]  Status: Final result     Next appt: 10/16/2023 at 10:00 AM in Cardiology (Boyce Byes, MD)     Dx: Neuropathy   Test Result Released: Yes (not seen)   1 Result Note    Component Ref Range & Units (hover) 1 mo ago  Hgb A1c MFr Bld 5.8 High   Comment:          Prediabetes: 5.7 - 6.4          Diabetes: >6.4          Glycemic control for adults with diabetes: <7.0  Est. average glucose Bld gHb Est-mCnc 120  Resulting Agency [facility]         Narrative Performed by: Trenia Fritter Performed at:  [address] - [facility] [address]  [place], [place]  [id number] Lab Director: Pearlean Botts MD, Phone:  [phone]  Specimen Collected: 08/26/23 11:10 Last Resulted: 08/27/23 05:35   Urinalysis See EPIC and HPI  I have reviewed the labs.   Pertinent Imaging: N/A  Assessment & Plan:    1. BPH with LUTS -  2. Prostate cancer -  3. Microscopic hematuria -   No follow-ups on file.  These notes generated with voice recognition software. I apologize for typographical errors.  Briant Camper  [facility] [address]  [address]  [address] [place], [place] [address] [phone]

## 2023-10-16 ENCOUNTER — Encounter: Payer: Self-pay | Admitting: Urology

## 2023-10-16 ENCOUNTER — Ambulatory Visit: Attending: Cardiology | Admitting: Cardiology

## 2023-10-16 ENCOUNTER — Encounter: Payer: Self-pay | Admitting: Cardiology

## 2023-10-16 ENCOUNTER — Ambulatory Visit (INDEPENDENT_AMBULATORY_CARE_PROVIDER_SITE_OTHER): Admitting: Urology

## 2023-10-16 VITALS — BP 120/54 | HR 76 | Ht 65.0 in | Wt 157.0 lb

## 2023-10-16 VITALS — BP 131/74 | HR 75 | Ht 65.0 in | Wt 157.0 lb

## 2023-10-16 DIAGNOSIS — R001 Bradycardia, unspecified: Secondary | ICD-10-CM

## 2023-10-16 DIAGNOSIS — C61 Malignant neoplasm of prostate: Secondary | ICD-10-CM | POA: Diagnosis not present

## 2023-10-16 DIAGNOSIS — Z950 Presence of cardiac pacemaker: Secondary | ICD-10-CM

## 2023-10-16 DIAGNOSIS — N138 Other obstructive and reflux uropathy: Secondary | ICD-10-CM | POA: Diagnosis not present

## 2023-10-16 DIAGNOSIS — R3129 Other microscopic hematuria: Secondary | ICD-10-CM

## 2023-10-16 DIAGNOSIS — N401 Enlarged prostate with lower urinary tract symptoms: Secondary | ICD-10-CM | POA: Diagnosis not present

## 2023-10-16 LAB — URINALYSIS, COMPLETE
Bilirubin, UA: NEGATIVE
Glucose, UA: NEGATIVE
Leukocytes,UA: NEGATIVE
Nitrite, UA: NEGATIVE
Protein,UA: NEGATIVE
Specific Gravity, UA: 1.01 (ref 1.005–1.030)
Urobilinogen, Ur: 1 mg/dL (ref 0.2–1.0)
pH, UA: 6 (ref 5.0–7.5)

## 2023-10-16 LAB — MICROSCOPIC EXAMINATION: Bacteria, UA: NONE SEEN

## 2023-10-16 LAB — PACEMAKER DEVICE OBSERVATION

## 2023-10-16 NOTE — Progress Notes (Signed)
  Electrophysiology Office Follow up Visit Note:    Date:  10/16/2023   ID:  Shane Vazquez, DOB 11-Sep-1929, MRN [id number]  PCP:  Patient, No Pcp Per  [facility] Cardiologist:  None  [facility] Electrophysiologist:  Boyce Byes, MD    Interval History:     Shane Vazquez is a 89 y.o. male who presents for a follow up visit.   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  Remote interrogations since implant have shown stable device function.  He has not been seen in clinic.  He is with his wife and an in person Spanish interpreter today.  He describes dizziness that has been ongoing.  This has led him to stop as much exercise as he was known previously.  He is still quite active.  No new medication changes.  No clear cause of the dizziness.  He does not describe a room spinning" sensation.        Past medical, surgical, social and family history were reviewed.  ROS:   Please see the history of present illness.    All other systems reviewed and are negative.  EKGs/Labs/Other Studies Reviewed:    The following studies were reviewed today:  October 16, 2023 in-clinic device interrogation personally reviewed Battery longevity 8.8 years Presenting rhythm AV sequential pacing Will increase rate response today given relatively flat histogram Lead parameters are all stable Atrial pacing 99.2% Ventricular pacing 99.9%        Physical Exam:    VS:  BP (!) 120/54   Pulse 76   Ht 5\' 5"  (1.651 m)   Wt 157 lb (71.2 kg)   SpO2 96%   BMI 26.13 kg/m     Wt Readings from Last 3 Encounters:  10/16/23 157 lb (71.2 kg)  09/07/23 158 lb 11.7 oz (72 kg)  08/26/23 157 lb 6 oz (71.4 kg)     GEN: no distress CARD: RRR, No MRG.  CIED pocket well-healed RESP: No IWOB. CTAB.      ASSESSMENT:    1. Sinus bradycardia   2. Cardiac pacemaker in situ    PLAN:    In order of problems listed above:  #Symptomatic  bradycardia #Permanent pacemaker in situ Device functioning appropriately.  Continue remote monitoring.  Follow-up in 2 years with APP.   Signed, Harvie Liner, MD, [facility], [facility] 10/16/2023 10:01 AM    Electrophysiology [facility]

## 2023-10-16 NOTE — Patient Instructions (Signed)
 Medication Instructions:  Your physician recommends that you continue on your current medications as directed. Please refer to the Current Medication list given to you today.  *If you need a refill on your cardiac medications before your next appointment, please call your pharmacy*  Follow-Up: At [facility], you and your health needs are our priority.  As part of our continuing mission to provide you with exceptional heart care, our providers are all part of one team.  This team includes your primary Cardiologist (physician) and Advanced Practice Providers or APPs (Physician Assistants and Nurse Practitioners) who all work together to provide you with the care you need, when you need it.  Your next appointment:   2 years  Provider:   You will see one of the following Advanced Practice Providers on your designated Care Team:   Mertha Abrahams, Kennard Pea [address] [place], PA-C Suzann Riddle, NP Creighton Doffing, NP

## 2023-10-17 ENCOUNTER — Encounter: Payer: Self-pay | Admitting: Cardiology

## 2023-10-17 ENCOUNTER — Ambulatory Visit
Admission: RE | Admit: 2023-10-17 | Discharge: 2023-10-17 | Disposition: A | Discharge status: Home / Self Care | Source: Ambulatory Visit | Attending: Urology | Admitting: Urology

## 2023-10-17 DIAGNOSIS — R3129 Other microscopic hematuria: Secondary | ICD-10-CM | POA: Diagnosis present

## 2023-10-17 LAB — PSA: Prostate Specific Ag, Serum: <0.1 ng/mL (ref 0.0–4.0)

## 2023-10-17 MED ORDER — IOHEXOL 300 MG/ML  SOLN
100.0000 mL | Freq: Once | INTRAMUSCULAR | Status: AC | PRN
  Administered 2023-10-17: 100 mL via INTRAVENOUS

## 2023-10-17 MED ORDER — SODIUM CHLORIDE 0.9 % IV SOLN: INTRAVENOUS | Status: DC

## 2023-10-22 ENCOUNTER — Other Ambulatory Visit (INDEPENDENT_AMBULATORY_CARE_PROVIDER_SITE_OTHER): Payer: Self-pay | Admitting: Vascular Surgery

## 2023-10-22 ENCOUNTER — Other Ambulatory Visit: Payer: Self-pay | Admitting: Student

## 2023-10-22 DIAGNOSIS — L409 Psoriasis, unspecified: Secondary | ICD-10-CM

## 2023-10-22 DIAGNOSIS — I700 Atherosclerosis of aorta: Secondary | ICD-10-CM

## 2023-10-23 ENCOUNTER — Encounter (INDEPENDENT_AMBULATORY_CARE_PROVIDER_SITE_OTHER): Payer: Self-pay | Admitting: Vascular Surgery

## 2023-10-23 ENCOUNTER — Ambulatory Visit (INDEPENDENT_AMBULATORY_CARE_PROVIDER_SITE_OTHER): Payer: Self-pay

## 2023-10-23 ENCOUNTER — Ambulatory Visit (INDEPENDENT_AMBULATORY_CARE_PROVIDER_SITE_OTHER): Payer: Self-pay | Admitting: Vascular Surgery

## 2023-10-23 VITALS — BP 148/82 | HR 74 | Resp 16 | Ht 64.0 in | Wt 154.4 lb

## 2023-10-23 DIAGNOSIS — M79606 Pain in leg, unspecified: Secondary | ICD-10-CM | POA: Diagnosis not present

## 2023-10-23 DIAGNOSIS — I700 Atherosclerosis of aorta: Secondary | ICD-10-CM

## 2023-10-23 DIAGNOSIS — I10 Essential (primary) hypertension: Secondary | ICD-10-CM | POA: Diagnosis not present

## 2023-10-23 DIAGNOSIS — E785 Hyperlipidemia, unspecified: Secondary | ICD-10-CM | POA: Diagnosis not present

## 2023-10-23 LAB — CUP PACEART INCLINIC DEVICE CHECK
Date Time Interrogation Session: 20250604112716
Implantable Lead Connection Status: 753985
Implantable Lead Connection Status: 753985
Implantable Lead Implant Date: 20230829
Implantable Lead Implant Date: 20230829
Implantable Lead Location: 753859
Implantable Lead Location: 753860
Implantable Lead Model: 3830
Implantable Lead Model: 5076
Implantable Pulse Generator Implant Date: 20230829

## 2023-10-23 NOTE — Progress Notes (Signed)
 Subjective:    Patient ID: Shane Vazquez, male    DOB: Jun 29, 1929, 89 y.o.   MRN: [id number] Chief Complaint  Patient presents with   Establish Care    New Patient ABI + consult Bilateral leg heaviness/numbness ref. Lang    [address] [place] is a 89 year old male who presents to the clinic today with chief complaint of bilateral lower extremity weakness and pain that increases with walking.  Patient only speaks Spanish and his daughter is at the bedside to interpret today.  Patient endorses over the last 5 to 6 months his lower extremities have gotten weaker.  He describes pain from the hip area to his bilateral thighs that extends down into his calf when he walks 300 feet or more.  He states this is alleviated by rest.  He also endorses having a history of abnormal gait with dizziness that has been worked up for years but no one's been able to figure out.  He does endorse that if he is holding onto a cart in the supermarket it is easier for him to walk longer distances.  He denies any numbness or tingling.  He does endorse having sharp shooting pains every once in a while but not consistently.    Review of Systems  Constitutional: Negative.   Respiratory: Negative.    Cardiovascular: Negative.   Musculoskeletal:  Positive for back pain.  Skin: Negative.   Neurological:  Positive for dizziness and weakness.  Psychiatric/Behavioral: Negative.    All other systems reviewed and are negative.      Objective:    Physical Exam Vitals reviewed.  Constitutional:      Appearance: Normal appearance. He is normal weight.     Comments: Patient speaks only Spanish and his daughter is interpreting for him at this visit  HENT:     Head: Normocephalic.  Eyes:     Pupils: Pupils are equal, round, and reactive to light.  Cardiovascular:     Rate and Rhythm: Normal rate and regular rhythm.     Pulses: Normal pulses.     Heart sounds: Normal heart sounds.  Pulmonary:     Effort: Pulmonary effort is  normal.     Breath sounds: Normal breath sounds.  Abdominal:     General: Abdomen is flat. Bowel sounds are normal.     Palpations: Abdomen is soft.  Musculoskeletal:     Cervical back: Normal range of motion.  Skin:    General: Skin is warm and dry.     Capillary Refill: Capillary refill takes more than 3 seconds.  Neurological:     General: No focal deficit present.     Mental Status: He is alert and oriented to person, place, and time. Mental status is at baseline.     Gait: Gait abnormal.     Comments: History of dizziness  Psychiatric:        Mood and Affect: Mood normal.        Behavior: Behavior normal.        Thought Content: Thought content normal.        Judgment: Judgment normal.     BP (!) 148/82 (BP Location: Right Arm, Patient Position: Sitting, Cuff Size: Normal)   Pulse 74   Resp 16   Ht 5' 4 (1.626 m)   Wt 154 lb 6.4 oz (70 kg)   BMI 26.50 kg/m   Past Medical History:  Diagnosis Date   Hypertension    Psoriasis    Renal disorder  sees urologist for hematuria   Shingles    Vertigo    Wears dentures    full upper and lower    Social History   Socioeconomic History   Marital status: Widowed    Spouse name: Not on file   Number of children: Not on file   Years of education: Not on file   Highest education level: Not on file  Occupational History   Not on file  Tobacco Use   Smoking status: Never    Passive exposure: Never   Smokeless tobacco: Never  Vaping Use   Vaping status: Never Used  Substance and Sexual Activity   Alcohol use: No   Drug use: No   Sexual activity: Not Currently  Other Topics Concern   Not on file  Social History Narrative   Not on file   Social Drivers of Health   Financial Resource Strain: Not on file  Food Insecurity: Not on file  Transportation Needs: Not on file  Physical Activity: Sufficiently Active (12/11/2017)   [facility]   Exercise  Vital Sign    Days of Exercise per Week: 4 days    Minutes of Exercise per Session: 60 min  Stress: Not on file  Social Connections: Not on file  Intimate Partner Violence: Not on file    Past Surgical History:  Procedure Laterality Date   ESOPHAGOGASTRODUODENOSCOPY     many yrs ago   PACEMAKER IMPLANT N/A 01/09/2022   Procedure: PACEMAKER IMPLANT;  Surgeon: Boyce Byes, MD;  Location: [facility] INVASIVE CV LAB;  Service: Cardiovascular;  Laterality: N/A;    Family History  Problem Relation Age of Onset   Stomach cancer Mother    Heart disease Father     No Known Allergies     Latest Ref Rng & Units 09/07/2023    1:14 PM 08/26/2023   11:10 AM 12/20/2021   12:36 PM  CBC  WBC 4.0 - 10.5 K/uL 4.8  3.8  3.6   Hemoglobin 13.0 - 17.0 g/dL 16.1  09.6  04.5   Hematocrit 39.0 - 52.0 % 36.7  38.3  33.6   Platelets 150 - 400 K/uL 131  149  154        CMP     Component Value Date/Time   NA 134 (L) 09/07/2023 1314   NA 138 08/26/2023 1110   K 4.5 09/07/2023 1314   CL 102 09/07/2023 1314   CO2 26 09/07/2023 1314   GLUCOSE 110 (H) 09/07/2023 1314   BUN 19 09/07/2023 1314   BUN 13 08/26/2023 1110   CREATININE 0.87 09/07/2023 1314   CALCIUM  9.0 09/07/2023 1314   PROT 6.6 09/07/2023 1314   PROT 6.9 08/26/2023 1110   ALBUMIN 3.6 09/07/2023 1314   ALBUMIN 4.1 08/26/2023 1110   AST 23 09/07/2023 1314   ALT 15 09/07/2023 1314   ALKPHOS 62 09/07/2023 1314   BILITOT 0.7 09/07/2023 1314   BILITOT 0.4 08/26/2023 1110   EGFR 76 08/26/2023 1110   GFRNONAA >60 09/07/2023 1314     No results found.     Assessment & Plan:   1. Leg pain, diffuse, unspecified laterality (Primary) Patient only speaks Spanish and daughter at the bedside is interpreting for him today.  Patient presents to clinic today with diffuse leg pain bilaterally.  He describes it as an aching and a throbbing starts in the hip area and descends through his thighs down to his calves.  Patient is unable to walk more  than 100 feet without claudication symptoms arising.  Patient underwent arterial duplex ultrasounds with ABIs today.  Both the right and left ABIs were greater than 1.0 but noncompressible.  He had biphasic waveforms bilaterally.  His symptoms therefore do not match the ultrasounds today.  However I do recommend due to the patient's age and noncompressibility which may show atherosclerotic disease throughout the patient should undergo a diagnostic and therapeutic bilateral lower extremity angiogram of his lower extremities.  This way we can rule out whether or not his symptoms are truly related to a vascular cause or whether they may be neurogenic.  Patient will be scheduled as outpatient for bilateral lower extremity angiograms.  Patient's daughter at his side is also requesting a consult to neurosurgery to investigate his lower back pain in the lumbar area after walking for period of time.  She claims for years now he has had gait difficulties with related dizziness that no one has been able to solve.  I agree with the consult as the difficulties with his legs could be a combination of neurogenic issues as well as vascular issues today.  2. Essential hypertension Continue antihypertensive medications as already ordered, these medications have been reviewed and there are no changes at this time.  3. Hyperlipidemia, unspecified hyperlipidemia type Continue statin as ordered and reviewed, no changes at this time   Current Outpatient Medications on File Prior to Visit  Medication Sig Dispense Refill   atorvastatin  (LIPITOR) 20 MG tablet TAKE 1 TABLET BY MOUTH EVERYDAY AT BEDTIME 30 tablet 11   bisacodyl (DULCOLAX) 5 MG EC tablet Take 5 mg by mouth daily as needed for moderate constipation.     Coal Tar Extract 2 % OINT Apply 1 Application topically daily as needed (psoriasis).     gabapentin  (NEURONTIN ) 300 MG capsule Take 1 capsule (300 mg total) by mouth 2 (two) times daily. 180 capsule 3   losartan   (COZAAR ) 50 MG tablet Take 1 tablet (50 mg total) by mouth daily. 90 tablet 3   Olopatadine HCl (PAZEO) 0.7 % SOLN Place 1 drop into both eyes daily as needed (for allergies).     triamcinolone  (KENALOG ) 0.025 % ointment Apply 1 Application topically 2 (two) times daily. 30 g 1   No current facility-administered medications on file prior to visit.    There are no Patient Instructions on file for this visit. No follow-ups on file.   Annamaria Barrette, NP

## 2023-10-24 LAB — VAS US ABI WITH/WO TBI
Left ABI: >1
Right ABI: >1

## 2023-10-24 NOTE — Telephone Encounter (Signed)
 Requested medication (s) are due for refill today -yes  Requested medication (s) are on the active medication list -yes  Future visit scheduled -yes  Last refill: 08/26/23 30g 1RF  Notes to clinic: non delegated Rx  Requested Prescriptions  Pending Prescriptions Disp Refills   triamcinolone  (KENALOG ) 0.025 % ointment [Pharmacy Med Name: TRIAMCINOLONE  0.025% OINT] 30 g 1    Sig: APPLY TO AFFECTED AREA TWICE A DAY     Not Delegated - Dermatology:  Corticosteroids Failed - 10/24/2023 11:23 AM      Failed - This refill cannot be delegated      Passed - Valid encounter within last 12 months    Recent Outpatient Visits           1 month ago Leg heaviness   [facility] [facility], MD       Future Appointments             In 2 weeks McGowan, Nyra Bellis [facility] [place]   In 1 month Barnetta Liberty, MD [facility] at [facility]               Requested Prescriptions  Pending Prescriptions Disp Refills   triamcinolone  (KENALOG ) 0.025 % ointment [Pharmacy Med Name: TRIAMCINOLONE  0.025% OINT] 30 g 1    Sig: APPLY TO AFFECTED AREA TWICE A DAY     Not Delegated - Dermatology:  Corticosteroids Failed - 10/24/2023 11:23 AM      Failed - This refill cannot be delegated      Passed - Valid encounter within last 12 months    Recent Outpatient Visits           1 month ago Leg heaviness   [place] [facility], MD       Future Appointments             In 2 weeks McGowan, Nyra Bellis [facility] [place]   In 1 month Barnetta Liberty, MD [facility] at [facility], [facility]

## 2023-10-26 ENCOUNTER — Ambulatory Visit: Payer: Self-pay | Admitting: Cardiology

## 2023-11-07 NOTE — Progress Notes (Unsigned)
 11/08/2023 8:42 AM   Shane Vazquez Aug 16, 1929 [id number]  Referring provider: No referring provider defined for this encounter.  Urological history: 1. Urinary retention -during admission for weakness and bradycardia (2023)   2. Prostate cancer -PSA (2018) 6.54 -Treated in [place]  with radiation several years ago  Chief Complaint  Patient presents with   Prostate Cancer   Urinary Retention   Benign Prostatic Hypertrophy   HPI: Shane Vazquez is a 89 y.o. male who presents today for CT report with interpreter Alan and his daughter, Doyal.    Previous records reviewed.     Patient underwent CT for further evaluation of microscopic hematuria, urgency and urge incontinence.  CT found no findings suspicious for stones, renal masses or bladder masses.  There was also no hydronephrosis.  He had bilateral simple renal cysts.    PMH: Past Medical History:  Diagnosis Date   Hypertension    Psoriasis    Renal disorder    sees urologist for hematuria   Shingles    Vertigo    Wears dentures    full upper and lower    Surgical History: Past Surgical History:  Procedure Laterality Date   ESOPHAGOGASTRODUODENOSCOPY     many yrs ago   PACEMAKER IMPLANT N/A 01/09/2022   Procedure: PACEMAKER IMPLANT;  Surgeon: Cindie Ole DASEN, MD;  Location: [facility] INVASIVE CV LAB;  Service: Cardiovascular;  Laterality: N/A;    Home Medications:  Allergies as of 11/08/2023   No Known Allergies      Medication List        Accurate as of November 08, 2023  8:42 AM. If you have any questions, ask your nurse or doctor.          atorvastatin  20 MG tablet Commonly known as: LIPITOR TAKE 1 TABLET BY MOUTH EVERYDAY AT BEDTIME   bisacodyl 5 MG EC tablet Commonly known as: DULCOLAX Take 5 mg by mouth daily as needed for moderate constipation.   Coal Tar Extract 2 % Oint Apply 1 Application topically daily as needed (psoriasis).   gabapentin  300 MG capsule Commonly known as:  NEURONTIN  Take 1 capsule (300 mg total) by mouth 2 (two) times daily.   losartan  50 MG tablet Commonly known as: COZAAR  Take 1 tablet (50 mg total) by mouth daily.   Pazeo 0.7 % Soln Generic drug: Olopatadine HCl Place 1 drop into both eyes daily as needed (for allergies).   triamcinolone  0.025 % ointment Commonly known as: KENALOG  APPLY TO AFFECTED AREA TWICE A DAY        Allergies: No Known Allergies  Family History: Family History  Problem Relation Age of Onset   Stomach cancer Mother    Heart disease Father     Social History:  reports that he has never smoked. He has never been exposed to tobacco smoke. He has never used smokeless tobacco. He reports that he does not drink alcohol and does not use drugs.  ROS: Pertinent ROS in HPI  Physical Exam: BP (!) 148/75   Pulse 77   Ht 5' 4 (1.626 m)   Wt 153 lb 8 oz (69.6 kg)   BMI 26.35 kg/m   Constitutional:  Well nourished. Alert and oriented, No acute distress. HEENT: [place] AT, moist mucus membranes.  Trachea midline Cardiovascular: No clubbing, cyanosis, or edema. Respiratory: Normal respiratory effort, no increased work of breathing. Neurologic: Grossly intact, no focal deficits, moving all 4 extremities. Psychiatric: Normal mood and affect.   Laboratory Data: N/A  Pertinent  Imaging: N/A  Assessment & Plan:    1. BPH with LUTS - Still experiencing urinary urgency and urge incontinence - CT scan did not identify any ureteral stones or any other findings that would explain urinary urgency and urge incontinence  2. Prostate cancer - PSA undetectable  3. Microscopic hematuria - UA still with micro heme - CTU (10/2023) - no worrisome findings --Explained that the cystoscopy is a safe and common diagnostic test performed by one of our physicians  in the office.  It consist of using a thin, lighted tube to look directly inside the bladder, prostate  and urethra to evaluate the anatomy.  The procedure is  brief, typically taking about 5 minutes. -This will enable us  to assess bladder health, diagnose and enlarged prostate, assess which BPH procedure may be most appropriate and rule out other bladder conditions (stricture disease, stones, cancer, etc.)  -Advised the patient that there are no restrictions to eating or drinking prior to the cystoscopy -They can continue to take all of their medications as prescribed -They can drive themselves to and from the appointment -I explained that during the procedure, the area around the urethra will be cleansed thoroughly, topical anesthetic will be applied to numb your urethra, the thin tube is then gently inserted through the urethra into your bladder while fluid flows through the tube to the bladder to enable better visualization -I explained the procedure is usually not painful, however there may be some discomfort (pinching feeling), and they may feel an urge to urinate, coolness or fullness in the bladder and then the cystoscope is removed -After the cystoscopy, I advised them that they may experience urinary frequency, hematuria, dysuria which will resolve within 24 to 48 hours -Reviewed red flag signs (fever, bright red blood or blood clots in the urine, abdominal pain or difficulty urinating) and to contact the office immediately or seek treatment in the ED if they should experience any of these -The physician will discuss the results of the cystoscopy at the time of the procedure    No follow-ups on file.  These notes generated with voice recognition software. I apologize for typographical errors.  CLOTILDA HELON RIGGERS  [facility] [address]  [address] [place], [place] [address] [phone]

## 2023-11-08 ENCOUNTER — Ambulatory Visit (INDEPENDENT_AMBULATORY_CARE_PROVIDER_SITE_OTHER): Admitting: Urology

## 2023-11-08 VITALS — BP 148/75 | HR 77 | Ht 64.0 in | Wt 153.5 lb

## 2023-11-08 DIAGNOSIS — N138 Other obstructive and reflux uropathy: Secondary | ICD-10-CM | POA: Diagnosis not present

## 2023-11-08 DIAGNOSIS — C61 Malignant neoplasm of prostate: Secondary | ICD-10-CM

## 2023-11-08 DIAGNOSIS — N401 Enlarged prostate with lower urinary tract symptoms: Secondary | ICD-10-CM

## 2023-11-08 DIAGNOSIS — R3129 Other microscopic hematuria: Secondary | ICD-10-CM | POA: Diagnosis not present

## 2023-11-08 NOTE — Patient Instructions (Signed)

## 2023-11-12 ENCOUNTER — Encounter: Payer: Self-pay | Admitting: Urology

## 2023-11-19 ENCOUNTER — Encounter: Payer: Self-pay | Admitting: Student

## 2023-11-19 ENCOUNTER — Ambulatory Visit (INDEPENDENT_AMBULATORY_CARE_PROVIDER_SITE_OTHER): Admitting: Student

## 2023-11-19 VITALS — BP 112/78 | HR 73 | Ht 64.0 in | Wt 152.0 lb

## 2023-11-19 DIAGNOSIS — R29898 Other symptoms and signs involving the musculoskeletal system: Secondary | ICD-10-CM

## 2023-11-19 DIAGNOSIS — E785 Hyperlipidemia, unspecified: Secondary | ICD-10-CM | POA: Diagnosis not present

## 2023-11-19 DIAGNOSIS — F321 Major depressive disorder, single episode, moderate: Secondary | ICD-10-CM | POA: Diagnosis not present

## 2023-11-19 DIAGNOSIS — Z8546 Personal history of malignant neoplasm of prostate: Secondary | ICD-10-CM | POA: Diagnosis not present

## 2023-11-19 MED ORDER — ATORVASTATIN CALCIUM 20 MG PO TABS: 20.0000 mg | ORAL_TABLET | Freq: Every day | ORAL | 1 refills | Status: AC

## 2023-11-19 NOTE — Progress Notes (Unsigned)
 Established Patient Office Visit  Subjective   Patient ID: Shane Vazquez, male    DOB: 08/10/1929  Age: 89 y.o. MRN: [id number]  Chief Complaint  Patient presents with   Hypertension   Hyperlipidemia   Corran Vazquez presents with daughter today for worsening mood .Reports anhedonia anxiety, and depressed mood since moving to [place]. Misses friends who live in [place] . Also endorses trouble falling asleep and decreased appetite. He denies  SI, HI, or Avd. Planning on going to [place]  for a few weeks.   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  HENT:     Mouth/Throat:     Mouth: Mucous membranes are moist.     Pharynx: Oropharynx is clear.  Cardiovascular:     Rate and Rhythm: Normal rate and regular rhythm.  Pulmonary:     Effort: Pulmonary effort is normal.     Breath sounds: No rhonchi or rales.  Abdominal:     General: Abdomen is flat. Bowel sounds are normal. There is no distension.     Palpations: Abdomen is soft.     Tenderness: There is no abdominal tenderness.  Musculoskeletal:        General: Normal range of motion.     Right lower leg: No edema.     Left lower leg: No edema.  Skin:     General: Skin is warm and dry.     Capillary Refill: Capillary refill takes less than 2 seconds.  Neurological:     General: No focal deficit present.     Mental Status: He is alert and oriented to person, place, and time.     Comments: Symmetric sensation to light touch of b/l lower extremities, normal and symmetric patellar and achilles reflexes  Psychiatric:        Mood and Affect: Mood normal.        Behavior: Behavior normal.        11/19/2023    2:42 PM 08/26/2023    9:39 AM  Depression screen PHQ 2/9  Decreased Interest 3 1  Down, Depressed, Hopeless 3 1  PHQ - 2 Score 6 2  Altered sleeping 2 3  Tired, decreased energy 2 2  Change in appetite 1 2  Feeling bad or failure about yourself  3 1  Trouble concentrating 0 1  Moving slowly or fidgety/restless 0 1  Suicidal thoughts 0 0  PHQ-9 Score 14 12  Difficult doing work/chores Very difficult Somewhat difficult       11/19/2023    2:42 PM 08/26/2023    9:39 AM  GAD 7 : Generalized Anxiety Score  Nervous, Anxious, on Edge 3 1  Control/stop worrying 3 1  Worry too much - different things 3 1  Trouble relaxing 3 2  Restless 3 1  Easily annoyed or irritable 3 0  Afraid - awful might happen 3 0  Total GAD 7 Score 21 6  Anxiety Difficulty Extremely difficult     No results found for any visits on 11/19/23.  Last CBC Lab Results  Component Value Date   WBC 4.8 09/07/2023   HGB 12.3 (L) 09/07/2023   HCT 36.7 (L) 09/07/2023   MCV 95.1 09/07/2023   MCH 31.9 09/07/2023   RDW 12.5 09/07/2023   PLT 131 (L) 09/07/2023      The ASCVD Risk score (Arnett DK, et al., 2019) failed to calculate for the following reasons:   The 2019 ASCVD risk score is only valid for ages 78 to 9    Assessment & Plan:  Current moderate episode of major depressive disorder without prior episode (HCC) Assessment & Plan: Elevated PHQ9 and GAD are elevated today. Will start on zoloft  to see if this helps. Referral made for therapy would be  best if they are spanish speaking. Follow up in 2 months.   Orders: -     AMB Referral [facility] Care Management  Hyperlipidemia, unspecified hyperlipidemia type Assessment & Plan: Continue current medication, sent refills to pharmacy.    Leg heaviness Assessment & Plan: Needs to call vascular surgery for CTA, daughter will obtain [place]  records. Handicap placard signed today.    History of prostate cancer Assessment & Plan: CT hematuria scan was normal in June plan for cystoscopy for further workup of hematuria with urology   Other orders -     Atorvastatin  Calcium ; Take 1 tablet (20 mg total) by mouth daily.  Dispense: 90 tablet; Refill: 1     Return in about 2 months (around 01/20/2024) for mood.    Harlene Saddler, MD

## 2023-11-19 NOTE — Assessment & Plan Note (Signed)
 Continue current medication, sent refills to pharmacy.

## 2023-11-19 NOTE — Assessment & Plan Note (Signed)
 Needs to call vascular surgery for CTA, daughter will obtain [place]  records. Handicap placard signed today.

## 2023-11-20 ENCOUNTER — Telehealth: Payer: Self-pay

## 2023-11-20 DIAGNOSIS — F329 Major depressive disorder, single episode, unspecified: Secondary | ICD-10-CM | POA: Insufficient documentation

## 2023-11-20 MED ORDER — SERTRALINE HCL 25 MG PO TABS
25.0000 mg | ORAL_TABLET | Freq: Every day | ORAL | 3 refills | Status: DC
Start: 2023-11-20 — End: 2024-03-26

## 2023-11-20 NOTE — Assessment & Plan Note (Addendum)
 Elevated PHQ9 and GAD are elevated today. Will start on zoloft  to see if this helps. Referral made for therapy would be best if they are spanish speaking. Follow up in 2 months.

## 2023-11-20 NOTE — Telephone Encounter (Signed)
 Spoke with patient's daughter Doyal, informed her that medication was sent in and may pick up at [facility] pharmacy.

## 2023-11-20 NOTE — Telephone Encounter (Signed)
 Copied from CRM [id number]. Topic: Clinical - Medication Question >> Nov 20, 2023  4:19 PM Selinda RAMAN wrote: Reason for CRM: Doyal the daughter of the patient called in because she said Zoloft  was supposed to be called in but the pharmacy still does not have it. After looking at the notes the provider said wiill start on zoloft  to see if this helps. Please assist patient as soon as possible.

## 2023-11-20 NOTE — Assessment & Plan Note (Signed)
 CT hematuria scan was normal in June plan for cystoscopy for further workup of hematuria with urology

## 2023-11-20 NOTE — Addendum Note (Signed)
 Addended by: LEMON RAISIN on: 11/20/2023 04:39 PM   Modules accepted: Orders

## 2023-11-25 ENCOUNTER — Telehealth: Payer: Self-pay

## 2023-11-25 NOTE — Progress Notes (Unsigned)
 Complex Care Management Note Care Guide Note  11/25/2023 Name: Shane Vazquez MRN: [id number] DOB: March 12, 1930   Complex Care Management Outreach Attempts: An unsuccessful telephone outreach was attempted today to offer the patient information about available complex care management services.  Follow Up Plan:  Additional outreach attempts will be made to offer the patient complex care management information and services.   Encounter Outcome:  No Answer  Leotis Rase [facility], [facility] Guide  Direct Dial: [phone]  Fax [phone]

## 2023-11-26 ENCOUNTER — Encounter: Admitting: Student

## 2023-11-29 NOTE — Addendum Note (Signed)
 Addended by: VICCI SELLER A on: 11/29/2023 03:05 PM   Modules accepted: Orders

## 2023-11-29 NOTE — Progress Notes (Signed)
 Remote pacemaker transmission.

## 2023-11-29 NOTE — Progress Notes (Signed)
 Complex Care Management Note Care Guide Note  11/29/2023 Name: Mervil Wacker MRN: [id number] DOB: Feb 18, 1930   Complex Care Management Outreach Attempts: A second unsuccessful outreach was attempted today to offer the patient with information about available complex care management services.  Follow Up Plan:  Additional outreach attempts will be made to offer the patient complex care management information and services.   Encounter Outcome:  No Answer  Leotis Rase [facility], [facility] Guide  Direct Dial: [phone]  Fax [phone]

## 2023-11-29 NOTE — Progress Notes (Signed)
 Complex Care Management Note Care Guide Note  11/29/2023 Name: Shane Vazquez MRN: [id number] DOB: 04/03/1930   Complex Care Management Outreach Attempts: A third unsuccessful outreach was attempted today to offer the patient with information about available complex care management services.  Follow Up Plan:  No further outreach attempts will be made at this time. We have been unable to contact the patient to offer or enroll patient in complex care management services.  Encounter Outcome:  No Answer  Leotis Rase [facility], [facility] Guide  Direct Dial: [phone]  Fax [phone]

## 2023-12-04 ENCOUNTER — Encounter: Payer: Self-pay | Admitting: Urology

## 2023-12-04 ENCOUNTER — Other Ambulatory Visit: Admitting: Urology

## 2023-12-12 ENCOUNTER — Other Ambulatory Visit: Payer: Self-pay | Admitting: Student

## 2023-12-13 NOTE — Telephone Encounter (Signed)
 Requested Prescriptions  Pending Prescriptions Disp Refills   sertraline  (ZOLOFT ) 25 MG tablet [Pharmacy Med Name: SERTRALINE  HCL 25 MG TABLET] 90 tablet 2    Sig: TAKE 1 TABLET (25 MG TOTAL) BY MOUTH DAILY.     Psychiatry:  Antidepressants - SSRI - sertraline  Passed - 12/13/2023 12:38 PM      Passed - AST in normal range and within 360 days    AST  Date Value Ref Range Status  09/07/2023 23 15 - 41 U/L Final         Passed - ALT in normal range and within 360 days    ALT  Date Value Ref Range Status  09/07/2023 15 0 - 44 U/L Final         Passed - Completed PHQ-2 or PHQ-9 in the last 360 days      Passed - Valid encounter within last 6 months    Recent Outpatient Visits           3 weeks ago Current moderate episode of major depressive disorder without prior episode [facility])   [place] [facility] at [facility], MD   3 months ago Leg heaviness   [facility] at [facility], MD

## 2023-12-31 ENCOUNTER — Ambulatory Visit (INDEPENDENT_AMBULATORY_CARE_PROVIDER_SITE_OTHER): Admitting: Urology

## 2023-12-31 VITALS — BP 166/77 | HR 73 | Wt 153.8 lb

## 2023-12-31 DIAGNOSIS — R3121 Asymptomatic microscopic hematuria: Secondary | ICD-10-CM

## 2023-12-31 DIAGNOSIS — R3915 Urgency of urination: Secondary | ICD-10-CM | POA: Diagnosis not present

## 2023-12-31 DIAGNOSIS — R399 Unspecified symptoms and signs involving the genitourinary system: Secondary | ICD-10-CM

## 2023-12-31 DIAGNOSIS — R350 Frequency of micturition: Secondary | ICD-10-CM | POA: Diagnosis not present

## 2023-12-31 DIAGNOSIS — R3129 Other microscopic hematuria: Secondary | ICD-10-CM

## 2023-12-31 MED ORDER — TAMSULOSIN HCL 0.4 MG PO CAPS: 0.4000 mg | ORAL_CAPSULE | Freq: Every day | ORAL | 11 refills | Status: AC

## 2023-12-31 NOTE — Addendum Note (Signed)
 Addended by: Caralynn Gelber A on: 12/31/2023 03:36 PM   Modules accepted: Orders

## 2023-12-31 NOTE — Progress Notes (Signed)
 Cystoscopy Procedure Note:  Indication: Microscopic hematuria, urinary urgency/frequency  Keflex given for prophylaxis  89 year old male with history of prostate cancer reportedly treated with radiation around 2022 in [place] , those records are not available to me.  He developed retention in 2023 and I placed a 16 French catheter at that time.  He saw our PA recently reporting persistent urgency and frequency, had microscopic hematuria, and she ordered CT and schedule him for cystoscopy.  Recent PVRs have been normal.  After informed consent and discussion of the procedure and its risks, Shane Vazquez was positioned and prepped in the standard fashion. Cystoscopy was performed with a flexible cystoscope. The urethra, bladder neck and entire bladder was visualized in a standard fashion. The prostate was moderate in size. The ureteral orifices were visualized in their normal location and orientation.  Bladder mucosa grossly normal throughout, no abnormalities on retroflexion  Imaging: CT urogram dated 10/17/2023 benign  Findings: Normal cystoscopy  Assessment and Plan: -Recommend trial of Flomax  for his mild urinary symptoms, most likely secondary to radiation for prostate cancer, avoid bladder irritants.  Would use caution with OAB medications with his age and history of retention -Negative microscopic hematuria workup  Redell Burnet, MD 12/31/2023

## 2024-01-08 ENCOUNTER — Ambulatory Visit (INDEPENDENT_AMBULATORY_CARE_PROVIDER_SITE_OTHER): Payer: Medicare HMO

## 2024-01-08 ENCOUNTER — Ambulatory Visit (INDEPENDENT_AMBULATORY_CARE_PROVIDER_SITE_OTHER): Admitting: Student

## 2024-01-08 ENCOUNTER — Encounter: Payer: Self-pay | Admitting: Student

## 2024-01-08 VITALS — BP 116/64 | HR 74 | Ht 64.0 in | Wt 154.0 lb

## 2024-01-08 DIAGNOSIS — U071 COVID-19: Secondary | ICD-10-CM | POA: Diagnosis not present

## 2024-01-08 DIAGNOSIS — R001 Bradycardia, unspecified: Secondary | ICD-10-CM

## 2024-01-08 LAB — POC COVID19 BINAXNOW: SARS Coronavirus 2 Ag: POSITIVE — AB

## 2024-01-08 MED ORDER — NIRMATRELVIR/RITONAVIR (PAXLOVID)TABLET: 3.0000 | ORAL_TABLET | Freq: Two times a day (BID) | ORAL | 0 refills | Status: AC

## 2024-01-08 NOTE — Progress Notes (Signed)
 Established Patient Office Visit  Subjective   Patient ID: Shane Vazquez, male    DOB: Mar 17, 1930  Age: 89 y.o. MRN: [id number]  Chief Complaint  Patient presents with   Cough   Covid Positive    Patient was exposed to COVID positive daughter and her husband, they tested positive on Saturday, also exposed to FLU and wish to be tested today    Shane Vazquez with medical hx listed below presents today for cough. Reports he was exposed to daughter and husband who tested positive for COVID 4 days ago. Granddaughter also have the flu. Patient lives upstairs of extended family. Dry cough and body aches, and abdominal pain for the past 3 days. Overall symptoms are Fevers, chills, shortness of breath, n/v, dizziness, HA, or syncope. Eating and drinking well at home.    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   bisacodyl (DULCOLAX) 5 MG EC tablet Take 5 mg by mouth daily as needed for moderate constipation.   Coal Tar Extract 2 % OINT Apply 1 Application topically daily as needed (psoriasis).   gabapentin  (NEURONTIN ) 300 MG capsule Take 1 capsule (300 mg total) by mouth 2 (two) times daily.   losartan  (COZAAR ) 50 MG tablet Take 1 tablet (50 mg total) by mouth daily.   nirmatrelvir /ritonavir  (PAXLOVID ) 20 x 150 MG & 10  x 100MG  TABS Take 3 tablets by mouth 2 (two) times daily for 5 days. (Take nirmatrelvir  150 mg two tablets twice daily for 5 days and ritonavir  100 mg one tablet twice daily for 5 days) Patient GFR is >60   Olopatadine HCl (PAZEO) 0.7 % SOLN Place 1 drop into both eyes daily as needed (for allergies).   sertraline  (ZOLOFT ) 25 MG tablet Take 1 tablet (25 mg total) by mouth daily.   tamsulosin  (FLOMAX ) 0.4 MG CAPS capsule Take 1 capsule (0.4 mg total) by mouth daily.   tizanidine (ZANAFLEX) 2 MG capsule Take 2 mg by mouth in the morning and at bedtime.   triamcinolone  (KENALOG ) 0.025 % ointment APPLY TO AFFECTED AREA TWICE A DAY   No facility-administered encounter medications on file as of 01/08/2024.    BP 116/64   Pulse 74   Ht 5' 4 (1.626 m)   Wt 154 lb (69.9 kg)   SpO2 97%   BMI 26.43 kg/m  BP Readings from Last 3 Encounters:  01/08/24 116/64  12/31/23 (!) 166/77  11/19/23 112/78    Physical Exam Constitutional:      Appearance: Normal appearance.  HENT:     Mouth/Throat:     Mouth: Mucous membranes are moist.     Pharynx: Oropharynx is clear.  Cardiovascular:     Rate and Rhythm: Normal rate and regular rhythm.  Pulmonary:     Effort: Pulmonary effort is normal. No respiratory  distress.     Breath sounds: No rhonchi or rales.  Abdominal:     General: Abdomen is flat. Bowel sounds are normal. There is no distension.     Palpations: Abdomen is soft.     Tenderness: There is no abdominal tenderness.  Musculoskeletal:        General: Normal range of motion.     Right lower leg: No edema.     Left lower leg: No edema.  Skin:    General: Skin is warm and dry.     Capillary Refill: Capillary refill takes less than 2 seconds.  Neurological:     General: No focal deficit present.     Mental Status: He is alert and oriented to person, place, and time.  Psychiatric:        Mood and Affect: Mood normal.        Behavior: Behavior normal.        11/19/2023    2:42 PM  08/26/2023    9:39 AM  Depression screen PHQ 2/9  Decreased Interest 3 1  Down, Depressed, Hopeless 3 1  PHQ - 2 Score 6 2  Altered sleeping 2 3  Tired, decreased energy 2 2  Change in appetite 1 2  Feeling bad or failure about yourself  3 1  Trouble concentrating 0 1  Moving slowly or fidgety/restless 0 1  Suicidal thoughts 0 0  PHQ-9 Score 14 12  Difficult doing work/chores Very difficult Somewhat difficult       11/19/2023    2:42 PM 08/26/2023    9:39 AM  GAD 7 : Generalized Anxiety Score  Nervous, Anxious, on Edge 3 1  Control/stop worrying 3 1  Worry too much - different things 3 1  Trouble relaxing 3 2  Restless 3 1  Easily annoyed or irritable 3 0  Afraid - awful might happen 3 0  Total GAD 7 Score 21 6  Anxiety Difficulty Extremely difficult     Results for orders placed or performed in visit on 01/08/24  POC COVID-19 BinaxNow  Result Value Ref Range   SARS Coronavirus 2 Ag Positive (A) Negative    Last CBC Lab Results  Component Value Date   WBC 4.8 09/07/2023   HGB 12.3 (L) 09/07/2023   HCT 36.7 (L) 09/07/2023   MCV 95.1 09/07/2023   MCH 31.9 09/07/2023   RDW 12.5 09/07/2023   PLT 131 (L) 09/07/2023      The ASCVD Risk score (Arnett DK, et al., 2019) failed to calculate for the following reasons:   The 2019 ASCVD risk score is only valid for ages 48 to 58    Assessment & Plan:  COVID-19 virus infection Positive COVID test in office today. Also possible influenza vaccine, we do not have flu test in office today. Outside of window for tamiflu. Exam is reassuring today with stable vitals. Given age, hx of heart disease, and pancytopenia will start on paxlovid . Will have him hold flomax  while on this. Encourage PO hydration and as needed tylenol .   -     POC COVID-19 BinaxNow  Other orders -     nirmatrelvir /ritonavir ; Take 3 tablets by mouth 2 (two) times daily for 5 days. (Take nirmatrelvir  150 mg two tablets twice daily for 5 days and ritonavir  100  mg one tablet twice daily for 5 days) Patient GFR is >60  Dispense: 30 tablet; Refill: 0     No follow-ups on file.    Harlene Saddler, MD

## 2024-01-09 LAB — CUP PACEART REMOTE DEVICE CHECK
Battery Remaining Longevity: 107 mo
Battery Voltage: 3.01 V
Brady Statistic AP VP Percent: 99.58 %
Brady Statistic AP VS Percent: 0.05 %
Brady Statistic AS VP Percent: 0.37 %
Brady Statistic AS VS Percent: 0 %
Brady Statistic RA Percent Paced: 99.63 %
Brady Statistic RV Percent Paced: 99.95 %
Date Time Interrogation Session: 20250826190444
Implantable Lead Connection Status: 753985
Implantable Lead Connection Status: 753985
Implantable Lead Implant Date: 20230829
Implantable Lead Implant Date: 20230829
Implantable Lead Location: 753859
Implantable Lead Location: 753860
Implantable Lead Model: 3830
Implantable Lead Model: 5076
Implantable Pulse Generator Implant Date: 20230829
Lead Channel Impedance Value: 285 Ohm
Lead Channel Impedance Value: 342 Ohm
Lead Channel Impedance Value: 399 Ohm
Lead Channel Impedance Value: 608 Ohm
Lead Channel Pacing Threshold Amplitude: 0.5 V
Lead Channel Pacing Threshold Amplitude: 1.125 V
Lead Channel Pacing Threshold Pulse Width: 0.4 ms
Lead Channel Pacing Threshold Pulse Width: 0.4 ms
Lead Channel Sensing Intrinsic Amplitude: 3.625 mV
Lead Channel Sensing Intrinsic Amplitude: 3.625 mV
Lead Channel Sensing Intrinsic Amplitude: 31.625 mV
Lead Channel Setting Pacing Amplitude: 1.5 V
Lead Channel Setting Pacing Amplitude: 2.25 V
Lead Channel Setting Pacing Pulse Width: 0.4 ms
Lead Channel Setting Sensing Sensitivity: 0.9 mV
Zone Setting Status: 755011
Zone Setting Status: 755011

## 2024-01-10 ENCOUNTER — Ambulatory Visit: Payer: Self-pay | Admitting: Cardiology

## 2024-01-27 NOTE — Progress Notes (Signed)
 Remote PPM Transmission

## 2024-03-01 NOTE — Progress Notes (Deleted)
 03/02/2024 8:16 PM   Shane Vazquez March 19, 1930 [id number]  Referring provider: Lemon Raisin, MD [address] [place],  [place] [address]  Urological history: 1. Urinary retention -during admission for weakness and bradycardia (2023)    2. Prostate cancer -PSA (2018) 6.54 -Treated in [place]  with radiation several years ago  3. High risk hematuria - Non-smoker - CTU (10/2023) bilateral renal cysts  - cysto (12/2023) - NED   No chief complaint on file.  HPI: Shane Vazquez is a 89 y.o. man who presents today for follow up.   Previous records reviewed.  I PSS ***  He reports sensation of incomplete bladder emptying, urinary frequency, urinary intermittency, urinary urgency, a weak urinary stream, having to strain to void, nocturia x ***, leaking before being able to reach the restroom, leaking with coughing, leaking without awareness, and post void dribbling.     He is wearing *** pads//depends  daily.    Patient denies any modifying or aggravating factors.  Patient denies any recent UTI's, gross hematuria, dysuria or suprapubic/flank pain.  Patient denies any fevers, chills, nausea or vomiting.  ***  He has a family history of PCa, colon cancer, ovarian cancer and/or breast cancer with ***.   He does not have a family history of PCa, colon cancer, ovarian cancer, and/or breast cancer .***     PVR***  PSA (10/2023) <0.1  Serum creatinine (08/2023) 0.87, eGFR > 60  Hemoglobin A1c (08/2023) 5.8   PMH: Past Medical History:  Diagnosis Date   Hypertension    Psoriasis    Renal disorder    sees urologist for hematuria   Shingles    Vertigo    Wears dentures    full upper and lower    Surgical History: Past Surgical History:  Procedure Laterality Date   ESOPHAGOGASTRODUODENOSCOPY     many yrs ago   PACEMAKER IMPLANT N/A 01/09/2022   Procedure: PACEMAKER IMPLANT;  Surgeon: Cindie Ole DASEN, MD;  Location: [facility];  Service: Cardiovascular;   Laterality: N/A;    Home Medications:  Allergies as of 03/02/2024   No Known Allergies      Medication List        Accurate as of March 01, 2024  8:16 PM. If you have any questions, ask your nurse or doctor.          atorvastatin  20 MG tablet Commonly known as: LIPITOR Take 1 tablet (20 mg total) by mouth daily.   bisacodyl 5 MG EC tablet Commonly known as: DULCOLAX Take 5 mg by mouth daily as needed for moderate constipation.   Coal Tar Extract 2 % Oint Apply 1 Application topically daily as needed (psoriasis).   gabapentin  300 MG capsule Commonly known as: NEURONTIN  Take 1 capsule (300 mg total) by mouth 2 (two) times daily.   losartan  50 MG tablet Commonly known as: COZAAR  Take 1 tablet (50 mg total) by mouth daily.   Pazeo 0.7 % Soln Generic drug: Olopatadine HCl Place 1 drop into both eyes daily as needed (for allergies).   sertraline  25 MG tablet Commonly known as: ZOLOFT  Take 1 tablet (25 mg total) by mouth daily.   tamsulosin  0.4 MG Caps capsule Commonly known as: FLOMAX  Take 1 capsule (0.4 mg total) by mouth daily.   tizanidine 2 MG capsule Commonly known as: ZANAFLEX Take 2 mg by mouth in the morning and at bedtime.   triamcinolone  0.025 % ointment Commonly known as: KENALOG  APPLY TO AFFECTED AREA TWICE  A DAY        Allergies: No Known Allergies  Family History: Family History  Problem Relation Age of Onset   Stomach cancer Mother    Heart disease Father     Social History:  reports that he has never smoked. He has never been exposed to tobacco smoke. He has never used smokeless tobacco. He reports that he does not drink alcohol and does not use drugs.  ROS: Pertinent ROS in HPI  Physical Exam: There were no vitals taken for this visit.  Constitutional:  Well nourished. Alert and oriented, No acute distress. HEENT: [place] AT, moist mucus membranes.  Trachea midline, no masses. Cardiovascular: No clubbing, cyanosis, or  edema. Respiratory: Normal respiratory effort, no increased work of breathing. Neurologic: Grossly intact, no focal deficits, moving all 4 extremities. Psychiatric: Normal mood and affect.  Laboratory Data: See Epic and HPI   I have reviewed the labs.   Pertinent Imaging: ***  Assessment & Plan:  ***  1. BPH with LU TS - stable, improving, worsening mild, moderate severe symptoms *** - no signs of retention, infection or recurrence of prostate cancer - PSA up to date  - PVR < 300 cc *** -cysto (2025) - NED  - most bothersome symptoms are *** - encouraged avoiding bladder irritants, fluid restriction before bedtime and timed voiding's - Initiate 5 alpha reductase inhibitor (***), discussed side effects *** - Continue tamsulosin  0.4 mg daily, ***:refills given - Cannot tolerate medication or medication failure, schedule cystoscopy *** - educated on red flag symptoms: acute retention, gross hematuria, fever, severe pain - advised to call clinic or go to the ED if these occur - return to clinic in *** symptom re-evaluation ***  2. High risk hematuria - non smoker - recent work up (2025) NED - no reports of gross heme  3.  History of prostate cancer - Recent PSA undetectable  No follow-ups on file.  These notes generated with voice recognition software. I apologize for typographical errors.  CLOTILDA HELON RIGGERS  [facility] [address]  [address] [place], [place] [address] [phone]

## 2024-03-02 ENCOUNTER — Ambulatory Visit: Admitting: Urology

## 2024-03-02 ENCOUNTER — Encounter: Payer: Self-pay | Admitting: Urology

## 2024-03-02 DIAGNOSIS — R319 Hematuria, unspecified: Secondary | ICD-10-CM

## 2024-03-02 DIAGNOSIS — Z8546 Personal history of malignant neoplasm of prostate: Secondary | ICD-10-CM

## 2024-03-02 DIAGNOSIS — N138 Other obstructive and reflux uropathy: Secondary | ICD-10-CM

## 2024-03-24 ENCOUNTER — Other Ambulatory Visit: Payer: Self-pay | Admitting: Student

## 2024-03-26 NOTE — Telephone Encounter (Signed)
 Requested Prescriptions  Pending Prescriptions Disp Refills   sertraline  (ZOLOFT ) 25 MG tablet [Pharmacy Med Name: SERTRALINE  HCL 25 MG TABLET] 90 tablet 0    Sig: TAKE 1 TABLET (25 MG TOTAL) BY MOUTH DAILY.     Psychiatry:  Antidepressants - SSRI - sertraline  Passed - 03/26/2024  8:58 AM      Passed - AST in normal range and within 360 days    AST  Date Value Ref Range Status  09/07/2023 23 15 - 41 U/L Final         Passed - ALT in normal range and within 360 days    ALT  Date Value Ref Range Status  09/07/2023 15 0 - 44 U/L Final         Passed - Completed PHQ-2 or PHQ-9 in the last 360 days      Passed - Valid encounter within last 6 months    Recent Outpatient Visits           2 months ago COVID-19 virus infection   [place] [facility] at [facility], [place]   4 months ago Current moderate episode of major depressive disorder without prior episode [facility])    [facility] at [facility] MD   7 months ago Leg heaviness   [facility] at [facility], MD

## 2024-04-08 ENCOUNTER — Ambulatory Visit: Payer: Medicare HMO

## 2024-04-08 DIAGNOSIS — R001 Bradycardia, unspecified: Secondary | ICD-10-CM | POA: Diagnosis not present

## 2024-04-10 LAB — CUP PACEART REMOTE DEVICE CHECK
Battery Remaining Longevity: 105 mo
Battery Voltage: 3.01 V
Brady Statistic AP VP Percent: 99.58 %
Brady Statistic AP VS Percent: 0.05 %
Brady Statistic AS VP Percent: 0.37 %
Brady Statistic AS VS Percent: 0 %
Brady Statistic RA Percent Paced: 99.62 %
Brady Statistic RV Percent Paced: 99.95 %
Date Time Interrogation Session: 20251125211845
Implantable Lead Connection Status: 753985
Implantable Lead Connection Status: 753985
Implantable Lead Implant Date: 20230829
Implantable Lead Implant Date: 20230829
Implantable Lead Location: 753859
Implantable Lead Location: 753860
Implantable Lead Model: 3830
Implantable Lead Model: 5076
Implantable Pulse Generator Implant Date: 20230829
Lead Channel Impedance Value: 285 Ohm
Lead Channel Impedance Value: 361 Ohm
Lead Channel Impedance Value: 399 Ohm
Lead Channel Impedance Value: 608 Ohm
Lead Channel Pacing Threshold Amplitude: 0.5 V
Lead Channel Pacing Threshold Amplitude: 1.125 V
Lead Channel Pacing Threshold Pulse Width: 0.4 ms
Lead Channel Pacing Threshold Pulse Width: 0.4 ms
Lead Channel Sensing Intrinsic Amplitude: 3.375 mV
Lead Channel Sensing Intrinsic Amplitude: 3.375 mV
Lead Channel Sensing Intrinsic Amplitude: 31.625 mV
Lead Channel Setting Pacing Amplitude: 1.5 V
Lead Channel Setting Pacing Amplitude: 2.25 V
Lead Channel Setting Pacing Pulse Width: 0.4 ms
Lead Channel Setting Sensing Sensitivity: 0.9 mV
Zone Setting Status: 755011
Zone Setting Status: 755011

## 2024-04-13 ENCOUNTER — Ambulatory Visit: Payer: Self-pay | Admitting: Cardiology

## 2024-04-14 NOTE — Progress Notes (Signed)
 Remote PPM Transmission

## 2024-07-08 ENCOUNTER — Ambulatory Visit: Payer: Medicare HMO

## 2024-10-07 ENCOUNTER — Ambulatory Visit: Payer: Medicare HMO

## 2025-01-06 ENCOUNTER — Ambulatory Visit: Payer: Medicare HMO

## 2025-04-07 ENCOUNTER — Ambulatory Visit: Payer: Medicare HMO
